# Patient Record
Sex: Female | Born: 1955
Health system: Southern US, Community
[De-identification: ages and names within clinical notes are randomized; demographics above are authoritative.]

## PROBLEM LIST (undated history)

## (undated) DIAGNOSIS — E669 Obesity, unspecified: Secondary | ICD-10-CM

## (undated) DIAGNOSIS — T7840XA Allergy, unspecified, initial encounter: Secondary | ICD-10-CM

## (undated) DIAGNOSIS — I10 Essential (primary) hypertension: Secondary | ICD-10-CM

## (undated) HISTORY — DX: Allergy, unspecified, initial encounter: T78.40XA

## (undated) HISTORY — DX: Obesity, unspecified: E66.9

---

## 1980-10-02 HISTORY — PX: TUBAL LIGATION: SHX77

## 2000-08-01 ENCOUNTER — Encounter: Payer: Self-pay | Admitting: Emergency Medicine

## 2000-08-01 ENCOUNTER — Encounter: Admission: RE | Admit: 2000-08-01 | Discharge: 2000-08-01 | Payer: Self-pay | Admitting: Emergency Medicine

## 2000-10-02 ENCOUNTER — Encounter: Payer: Self-pay | Admitting: Emergency Medicine

## 2000-10-02 ENCOUNTER — Emergency Department (HOSPITAL_COMMUNITY): Admission: EM | Admit: 2000-10-02 | Discharge: 2000-10-02 | Payer: Self-pay | Admitting: Emergency Medicine

## 2001-08-01 ENCOUNTER — Encounter: Payer: Self-pay | Admitting: Emergency Medicine

## 2001-08-01 ENCOUNTER — Encounter: Admission: RE | Admit: 2001-08-01 | Discharge: 2001-08-01 | Payer: Self-pay | Admitting: Emergency Medicine

## 2001-10-30 ENCOUNTER — Other Ambulatory Visit: Admission: RE | Admit: 2001-10-30 | Discharge: 2001-10-30 | Payer: Self-pay | Admitting: Emergency Medicine

## 2002-07-17 ENCOUNTER — Encounter: Payer: Self-pay | Admitting: Family Medicine

## 2002-07-17 ENCOUNTER — Encounter: Admission: RE | Admit: 2002-07-17 | Discharge: 2002-07-17 | Payer: Self-pay | Admitting: Family Medicine

## 2002-08-05 ENCOUNTER — Encounter: Payer: Self-pay | Admitting: Emergency Medicine

## 2002-08-05 ENCOUNTER — Ambulatory Visit (HOSPITAL_COMMUNITY): Admission: RE | Admit: 2002-08-05 | Discharge: 2002-08-05 | Payer: Self-pay | Admitting: Emergency Medicine

## 2003-08-07 ENCOUNTER — Ambulatory Visit (HOSPITAL_COMMUNITY): Admission: RE | Admit: 2003-08-07 | Discharge: 2003-08-07 | Payer: Self-pay | Admitting: Emergency Medicine

## 2004-08-30 ENCOUNTER — Ambulatory Visit (HOSPITAL_COMMUNITY): Admission: RE | Admit: 2004-08-30 | Discharge: 2004-08-30 | Payer: Self-pay | Admitting: Pulmonary Disease

## 2005-09-18 ENCOUNTER — Ambulatory Visit (HOSPITAL_COMMUNITY): Admission: RE | Admit: 2005-09-18 | Discharge: 2005-09-18 | Payer: Self-pay | Admitting: Emergency Medicine

## 2006-07-09 ENCOUNTER — Encounter: Admission: RE | Admit: 2006-07-09 | Discharge: 2006-07-09 | Payer: Self-pay | Admitting: Occupational Medicine

## 2006-07-30 ENCOUNTER — Ambulatory Visit (HOSPITAL_COMMUNITY): Admission: RE | Admit: 2006-07-30 | Discharge: 2006-07-30 | Payer: Self-pay | Admitting: Emergency Medicine

## 2006-12-31 ENCOUNTER — Emergency Department (HOSPITAL_COMMUNITY): Admission: EM | Admit: 2006-12-31 | Discharge: 2006-12-31 | Payer: Self-pay | Admitting: Emergency Medicine

## 2007-10-08 ENCOUNTER — Ambulatory Visit (HOSPITAL_COMMUNITY): Admission: RE | Admit: 2007-10-08 | Discharge: 2007-10-08 | Payer: Self-pay | Admitting: Family Medicine

## 2009-07-27 ENCOUNTER — Ambulatory Visit (HOSPITAL_COMMUNITY): Admission: RE | Admit: 2009-07-27 | Discharge: 2009-07-27 | Payer: Self-pay | Admitting: Family Medicine

## 2009-08-06 ENCOUNTER — Other Ambulatory Visit: Admission: RE | Admit: 2009-08-06 | Discharge: 2009-08-06 | Payer: Self-pay | Admitting: *Deleted

## 2009-08-30 ENCOUNTER — Other Ambulatory Visit: Admission: RE | Admit: 2009-08-30 | Discharge: 2009-08-30 | Payer: Self-pay | Admitting: *Deleted

## 2010-07-28 ENCOUNTER — Ambulatory Visit (HOSPITAL_COMMUNITY): Admission: RE | Admit: 2010-07-28 | Discharge: 2010-07-28 | Payer: Self-pay | Admitting: Family Medicine

## 2011-07-18 ENCOUNTER — Other Ambulatory Visit (HOSPITAL_COMMUNITY): Payer: Self-pay | Admitting: Family Medicine

## 2011-07-18 DIAGNOSIS — Z1231 Encounter for screening mammogram for malignant neoplasm of breast: Secondary | ICD-10-CM

## 2011-07-31 ENCOUNTER — Other Ambulatory Visit (HOSPITAL_COMMUNITY)
Admission: RE | Admit: 2011-07-31 | Discharge: 2011-07-31 | Disposition: A | Discharge status: Home / Self Care | Payer: Self-pay | Source: Ambulatory Visit | Attending: Pediatrics | Admitting: Pediatrics

## 2011-07-31 ENCOUNTER — Other Ambulatory Visit: Payer: Self-pay | Admitting: Physician Assistant

## 2011-07-31 ENCOUNTER — Ambulatory Visit
Admission: RE | Admit: 2011-07-31 | Discharge: 2011-07-31 | Disposition: A | Discharge status: Home / Self Care | Payer: Self-pay | Source: Ambulatory Visit | Attending: Family Medicine | Admitting: Family Medicine

## 2011-07-31 DIAGNOSIS — Z1231 Encounter for screening mammogram for malignant neoplasm of breast: Secondary | ICD-10-CM

## 2011-07-31 DIAGNOSIS — Z113 Encounter for screening for infections with a predominantly sexual mode of transmission: Secondary | ICD-10-CM | POA: Insufficient documentation

## 2011-07-31 DIAGNOSIS — Z01419 Encounter for gynecological examination (general) (routine) without abnormal findings: Secondary | ICD-10-CM | POA: Insufficient documentation

## 2012-09-06 ENCOUNTER — Emergency Department (HOSPITAL_COMMUNITY)
Admission: EM | Admit: 2012-09-06 | Discharge: 2012-09-06 | Disposition: A | Discharge status: Home / Self Care | Payer: No Typology Code available for payment source

## 2012-09-06 NOTE — ED Notes (Signed)
Pt not in WR when called for triage.

## 2012-09-07 ENCOUNTER — Encounter (HOSPITAL_BASED_OUTPATIENT_CLINIC_OR_DEPARTMENT_OTHER): Payer: Self-pay | Admitting: *Deleted

## 2012-09-07 ENCOUNTER — Emergency Department (HOSPITAL_BASED_OUTPATIENT_CLINIC_OR_DEPARTMENT_OTHER)
Admission: EM | Admit: 2012-09-07 | Discharge: 2012-09-08 | Disposition: A | Discharge status: Home / Self Care | Payer: No Typology Code available for payment source | Attending: Emergency Medicine | Admitting: Emergency Medicine

## 2012-09-07 DIAGNOSIS — Y92410 Unspecified street and highway as the place of occurrence of the external cause: Secondary | ICD-10-CM | POA: Insufficient documentation

## 2012-09-07 DIAGNOSIS — M546 Pain in thoracic spine: Secondary | ICD-10-CM | POA: Insufficient documentation

## 2012-09-07 DIAGNOSIS — M545 Low back pain, unspecified: Secondary | ICD-10-CM | POA: Insufficient documentation

## 2012-09-07 DIAGNOSIS — Z79899 Other long term (current) drug therapy: Secondary | ICD-10-CM | POA: Insufficient documentation

## 2012-09-07 DIAGNOSIS — I10 Essential (primary) hypertension: Secondary | ICD-10-CM | POA: Insufficient documentation

## 2012-09-07 DIAGNOSIS — Y93I9 Activity, other involving external motion: Secondary | ICD-10-CM | POA: Insufficient documentation

## 2012-09-07 HISTORY — DX: Essential (primary) hypertension: I10

## 2012-09-07 MED ORDER — HYDROCODONE-ACETAMINOPHEN 5-325 MG PO TABS: 2.0000 | ORAL_TABLET | Freq: Four times a day (QID) | ORAL | Status: DC | PRN
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
## 2012-09-28 ENCOUNTER — Emergency Department (HOSPITAL_BASED_OUTPATIENT_CLINIC_OR_DEPARTMENT_OTHER): Payer: No Typology Code available for payment source

## 2012-09-28 ENCOUNTER — Emergency Department (HOSPITAL_BASED_OUTPATIENT_CLINIC_OR_DEPARTMENT_OTHER)
Admission: EM | Admit: 2012-09-28 | Discharge: 2012-09-28 | Disposition: A | Discharge status: Home / Self Care | Payer: No Typology Code available for payment source | Attending: Emergency Medicine | Admitting: Emergency Medicine

## 2012-09-28 ENCOUNTER — Encounter (HOSPITAL_BASED_OUTPATIENT_CLINIC_OR_DEPARTMENT_OTHER): Payer: Self-pay | Admitting: *Deleted

## 2012-09-28 DIAGNOSIS — I10 Essential (primary) hypertension: Secondary | ICD-10-CM | POA: Insufficient documentation

## 2012-09-28 DIAGNOSIS — Z79899 Other long term (current) drug therapy: Secondary | ICD-10-CM | POA: Insufficient documentation

## 2012-09-28 DIAGNOSIS — Z791 Long term (current) use of non-steroidal anti-inflammatories (NSAID): Secondary | ICD-10-CM | POA: Insufficient documentation

## 2012-09-28 DIAGNOSIS — M549 Dorsalgia, unspecified: Secondary | ICD-10-CM

## 2012-09-28 MED ORDER — IBUPROFEN 800 MG PO TABS: 800.0000 mg | ORAL_TABLET | Freq: Three times a day (TID) | ORAL | Status: DC | PRN

## 2012-09-28 MED ORDER — HYDROCODONE-ACETAMINOPHEN 5-325 MG PO TABS: 2.0000 | ORAL_TABLET | Freq: Four times a day (QID) | ORAL | Status: DC | PRN
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
## 2012-10-03 ENCOUNTER — Emergency Department (HOSPITAL_COMMUNITY): Payer: PRIVATE HEALTH INSURANCE

## 2012-10-03 ENCOUNTER — Emergency Department (HOSPITAL_COMMUNITY)
Admission: EM | Admit: 2012-10-03 | Discharge: 2012-10-03 | Disposition: A | Discharge status: Home / Self Care | Payer: PRIVATE HEALTH INSURANCE | Attending: Emergency Medicine | Admitting: Emergency Medicine

## 2012-10-03 ENCOUNTER — Encounter (HOSPITAL_COMMUNITY): Payer: Self-pay | Admitting: *Deleted

## 2012-10-03 DIAGNOSIS — Z79899 Other long term (current) drug therapy: Secondary | ICD-10-CM | POA: Insufficient documentation

## 2012-10-03 DIAGNOSIS — I10 Essential (primary) hypertension: Secondary | ICD-10-CM | POA: Insufficient documentation

## 2012-10-03 DIAGNOSIS — Y9289 Other specified places as the place of occurrence of the external cause: Secondary | ICD-10-CM | POA: Insufficient documentation

## 2012-10-03 DIAGNOSIS — S82899A Other fracture of unspecified lower leg, initial encounter for closed fracture: Secondary | ICD-10-CM | POA: Insufficient documentation

## 2012-10-03 DIAGNOSIS — Y9301 Activity, walking, marching and hiking: Secondary | ICD-10-CM | POA: Insufficient documentation

## 2012-10-03 DIAGNOSIS — S9306XA Dislocation of unspecified ankle joint, initial encounter: Secondary | ICD-10-CM | POA: Insufficient documentation

## 2012-10-03 DIAGNOSIS — W010XXA Fall on same level from slipping, tripping and stumbling without subsequent striking against object, initial encounter: Secondary | ICD-10-CM | POA: Insufficient documentation

## 2012-10-03 MED ORDER — BUPIVACAINE-EPINEPHRINE PF 0.25-1:200000 % IJ SOLN
INTRAMUSCULAR | Status: AC
  Administered 2012-10-03: 30 mL
  Filled 2012-10-03: qty 30

## 2012-10-03 MED ORDER — BUPIVACAINE HCL 0.25 % IJ SOLN: 20.0000 mL | Freq: Once | INTRAMUSCULAR | Status: DC

## 2012-10-03 MED ORDER — BUPIVACAINE HCL (PF) 0.25 % IJ SOLN: 20.0000 mL | Freq: Once | INTRAMUSCULAR | Status: AC

## 2012-10-03 MED ORDER — FENTANYL CITRATE 0.05 MG/ML IJ SOLN
100.0000 ug | Freq: Once | INTRAMUSCULAR | Status: AC
  Administered 2012-10-03: 100 ug via INTRAVENOUS
  Filled 2012-10-03: qty 2

## 2012-10-03 MED ORDER — OXYCODONE-ACETAMINOPHEN 5-325 MG PO TABS: 2.0000 | ORAL_TABLET | ORAL | Status: DC | PRN

## 2012-10-03 MED ORDER — FENTANYL CITRATE 0.05 MG/ML IJ SOLN
50.0000 ug | Freq: Once | INTRAMUSCULAR | Status: AC
  Administered 2012-10-03: 50 ug via INTRAVENOUS
  Filled 2012-10-03: qty 2
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
## 2012-10-03 NOTE — ED Notes (Signed)
WUJ:[id number]<JY> Expected date:<BR> Expected time:<BR> Means of arrival:Ambulance<BR> Comments:<BR> Ankle deformity

## 2012-10-03 NOTE — ED Notes (Signed)
Per md pt allowed to have PO fluids 

## 2012-10-03 NOTE — ED Notes (Signed)
Pt reports she gave a medicine bottle to ems, ems did not given any medication bottles to rn when arriving to ED

## 2012-10-03 NOTE — ED Notes (Signed)
Ortho tech at bedside 

## 2012-10-03 NOTE — ED Notes (Signed)
md at bedside  Pt alert and oriented x4. Respirations even and unlabored, bilateral symmetrical rise and fall of chest. Skin warm and dry. In no acute distress. Denies needs.   

## 2012-10-03 NOTE — ED Notes (Signed)
Pt alert and oriented x4. Respirations even and unlabored, bilateral symmetrical rise and fall of chest. Skin warm and dry. In no acute distress. Denies needs.   

## 2012-10-03 NOTE — ED Notes (Signed)
Per ems pt is [employer] employee. She was walking and slipped down a grass embankment. Deformity noted to right ankle/foot. Pedal pulse strong. Immobilized as found. Able to wiggle toes. PMS intact. 20 g L hand. Given fentanyl in route. Pain 8/10 upon arrival to ED

## 2012-10-03 NOTE — ED Notes (Signed)
Pt to xray

## 2012-10-07 ENCOUNTER — Other Ambulatory Visit (HOSPITAL_COMMUNITY): Payer: Self-pay | Admitting: Orthopaedic Surgery

## 2012-10-08 ENCOUNTER — Other Ambulatory Visit (HOSPITAL_COMMUNITY): Payer: Self-pay | Admitting: Orthopaedic Surgery

## 2012-10-08 ENCOUNTER — Encounter (HOSPITAL_COMMUNITY): Payer: Self-pay | Admitting: Pharmacy Technician

## 2012-10-08 NOTE — Patient Instructions (Addendum)
20 Savannah Peterson  10/08/2012   Your procedure is scheduled on: 10-11-2012  Report to [facility] at 1250 PM  Call this number if you have problems the morning of surgery [phone]   Remember:   Do not eat food  :After Midnight tonight.              clear Liquids midnight until 0920 am day of surgery, then nothing by mouth.     Take these medicines the morning of surgery with A SIP OF WATER: oxycodone if needed   Do not wear jewelry, make-up or nail polish.  Do not wear lotions, powders, or perfumes. You may wear deodorant.   Men may shave face and neck.  Do not bring valuables to the hospital.  Contacts, dentures or bridgework may not be worn into surgery.  Leave suitcase in the car. After surgery it may be brought to your room.  For patients admitted to the hospital, checkout time is 11:00 AM the day of discharge.   Patients discharged the day of surgery will not be allowed to drive home.  Name and phone number of your driver:  Special Instructions: N/A   Please read over the following fact sheets that you were given: MRSA Information.  Call Cain Sieve RN pre op nurse if needed [phone]    FAILURE TO FOLLOW THESE INSTRUCTIONS MAY RESULT IN THE CANCELLATION OF YOUR SURGERY. PATIENT SIGNATURE___________________________________________

## 2012-10-09 ENCOUNTER — Encounter (HOSPITAL_COMMUNITY): Payer: Self-pay

## 2012-10-09 ENCOUNTER — Encounter (HOSPITAL_COMMUNITY)
Admission: RE | Admit: 2012-10-09 | Discharge: 2012-10-09 | Disposition: A | Discharge status: Home / Self Care | Payer: PRIVATE HEALTH INSURANCE | Source: Ambulatory Visit | Attending: Orthopaedic Surgery | Admitting: Orthopaedic Surgery

## 2012-10-09 ENCOUNTER — Ambulatory Visit (HOSPITAL_COMMUNITY)
Admission: RE | Admit: 2012-10-09 | Discharge: 2012-10-09 | Disposition: A | Discharge status: Home / Self Care | Payer: PRIVATE HEALTH INSURANCE | Source: Ambulatory Visit | Attending: Orthopaedic Surgery | Admitting: Orthopaedic Surgery

## 2012-10-09 DIAGNOSIS — Z01818 Encounter for other preprocedural examination: Secondary | ICD-10-CM | POA: Insufficient documentation

## 2012-10-09 DIAGNOSIS — I10 Essential (primary) hypertension: Secondary | ICD-10-CM | POA: Insufficient documentation

## 2012-10-09 DIAGNOSIS — Z01810 Encounter for preprocedural cardiovascular examination: Secondary | ICD-10-CM | POA: Insufficient documentation

## 2012-10-09 DIAGNOSIS — R9431 Abnormal electrocardiogram [ECG] [EKG]: Secondary | ICD-10-CM | POA: Insufficient documentation

## 2012-10-09 DIAGNOSIS — Z01812 Encounter for preprocedural laboratory examination: Secondary | ICD-10-CM | POA: Insufficient documentation

## 2012-10-09 LAB — BASIC METABOLIC PANEL
BUN: 12 mg/dL (ref 6–23)
Calcium: 10.2 mg/dL (ref 8.4–10.5)
Creatinine, Ser: 0.69 mg/dL (ref 0.50–1.10)
GFR calc Af Amer: >90 mL/min (ref >90)
GFR calc non Af Amer: >90 mL/min (ref >90)

## 2012-10-09 LAB — SURGICAL PCR SCREEN
MRSA, PCR: NEGATIVE
Staphylococcus aureus: NEGATIVE

## 2012-10-09 LAB — CBC
MCHC: 33.6 g/dL (ref 30.0–36.0)
RDW: 13.7 % (ref 11.5–15.5)

## 2012-10-11 ENCOUNTER — Encounter (HOSPITAL_COMMUNITY)
Admission: RE | Disposition: A | Discharge status: Home / Self Care | Payer: Self-pay | Source: Ambulatory Visit | Attending: Orthopaedic Surgery

## 2012-10-11 ENCOUNTER — Ambulatory Visit (HOSPITAL_COMMUNITY): Payer: PRIVATE HEALTH INSURANCE | Admitting: Anesthesiology

## 2012-10-11 ENCOUNTER — Encounter (HOSPITAL_COMMUNITY): Payer: Self-pay | Admitting: Anesthesiology

## 2012-10-11 ENCOUNTER — Encounter (HOSPITAL_COMMUNITY): Payer: Self-pay | Admitting: *Deleted

## 2012-10-11 ENCOUNTER — Ambulatory Visit (HOSPITAL_COMMUNITY): Payer: PRIVATE HEALTH INSURANCE

## 2012-10-11 ENCOUNTER — Ambulatory Visit (HOSPITAL_COMMUNITY)
Admission: RE | Admit: 2012-10-11 | Discharge: 2012-10-12 | Disposition: A | Discharge status: Home / Self Care | Payer: PRIVATE HEALTH INSURANCE | Source: Ambulatory Visit | Attending: Orthopaedic Surgery | Admitting: Orthopaedic Surgery

## 2012-10-11 DIAGNOSIS — W19XXXA Unspecified fall, initial encounter: Secondary | ICD-10-CM | POA: Insufficient documentation

## 2012-10-11 DIAGNOSIS — S8263XA Displaced fracture of lateral malleolus of unspecified fibula, initial encounter for closed fracture: Principal | ICD-10-CM | POA: Insufficient documentation

## 2012-10-11 DIAGNOSIS — S8261XA Displaced fracture of lateral malleolus of right fibula, initial encounter for closed fracture: Secondary | ICD-10-CM

## 2012-10-11 DIAGNOSIS — I10 Essential (primary) hypertension: Secondary | ICD-10-CM | POA: Insufficient documentation

## 2012-10-11 DIAGNOSIS — S93439A Sprain of tibiofibular ligament of unspecified ankle, initial encounter: Secondary | ICD-10-CM | POA: Insufficient documentation

## 2012-10-11 HISTORY — PX: ORIF ANKLE FRACTURE: SHX5408

## 2012-10-11 SURGERY — OPEN REDUCTION INTERNAL FIXATION (ORIF) ANKLE FRACTURE
Anesthesia: General | Site: Ankle | Laterality: Right | Wound class: Clean

## 2012-10-11 MED ORDER — METHOCARBAMOL 500 MG PO TABS: 500.0000 mg | ORAL_TABLET | Freq: Four times a day (QID) | ORAL | Status: DC | PRN

## 2012-10-11 MED ORDER — FENTANYL CITRATE 0.05 MG/ML IJ SOLN
INTRAMUSCULAR | Status: DC | PRN
  Administered 2012-10-11 (×5): 50 ug via INTRAVENOUS

## 2012-10-11 MED ORDER — MORPHINE SULFATE 2 MG/ML IJ SOLN
1.0000 mg | INTRAMUSCULAR | Status: DC | PRN
  Administered 2012-10-11 – 2012-10-12 (×2): 1 mg via INTRAVENOUS
  Filled 2012-10-11 (×2): qty 1

## 2012-10-11 MED ORDER — CLINDAMYCIN PHOSPHATE 900 MG/50ML IV SOLN
900.0000 mg | INTRAVENOUS | Status: AC
  Administered 2012-10-11: 900 mg via INTRAVENOUS

## 2012-10-11 MED ORDER — ACETAMINOPHEN 10 MG/ML IV SOLN
INTRAVENOUS | Status: AC
  Filled 2012-10-11: qty 100

## 2012-10-11 MED ORDER — SUCCINYLCHOLINE CHLORIDE 20 MG/ML IJ SOLN
INTRAMUSCULAR | Status: DC | PRN
  Administered 2012-10-11: 100 mg via INTRAVENOUS

## 2012-10-11 MED ORDER — LISINOPRIL-HYDROCHLOROTHIAZIDE 20-25 MG PO TABS: 1.0000 | ORAL_TABLET | Freq: Every morning | ORAL | Status: DC

## 2012-10-11 MED ORDER — METOCLOPRAMIDE HCL 5 MG/ML IJ SOLN: 5.0000 mg | Freq: Three times a day (TID) | INTRAMUSCULAR | Status: DC | PRN

## 2012-10-11 MED ORDER — LACTATED RINGERS IV SOLN
INTRAVENOUS | Status: DC
  Administered 2012-10-11: 1000 mL via INTRAVENOUS

## 2012-10-11 MED ORDER — SODIUM CHLORIDE 0.9 % IV SOLN
INTRAVENOUS | Status: DC
  Administered 2012-10-11: 20:00:00 via INTRAVENOUS

## 2012-10-11 MED ORDER — PROMETHAZINE HCL 25 MG/ML IJ SOLN: 6.2500 mg | INTRAMUSCULAR | Status: DC | PRN

## 2012-10-11 MED ORDER — HYDROMORPHONE HCL PF 1 MG/ML IJ SOLN
0.2500 mg | INTRAMUSCULAR | Status: DC | PRN
  Administered 2012-10-11 (×2): 0.5 mg via INTRAVENOUS

## 2012-10-11 MED ORDER — CLINDAMYCIN PHOSPHATE 600 MG/50ML IV SOLN
600.0000 mg | Freq: Four times a day (QID) | INTRAVENOUS | Status: AC
  Administered 2012-10-11 – 2012-10-12 (×3): 600 mg via INTRAVENOUS
  Filled 2012-10-11 (×4): qty 50

## 2012-10-11 MED ORDER — METHOCARBAMOL 100 MG/ML IJ SOLN
500.0000 mg | Freq: Four times a day (QID) | INTRAVENOUS | Status: DC | PRN
  Filled 2012-10-11: qty 5

## 2012-10-11 MED ORDER — OXYCODONE HCL 5 MG PO TABS: 5.0000 mg | ORAL_TABLET | ORAL | Status: DC | PRN

## 2012-10-11 MED ORDER — LIDOCAINE HCL (CARDIAC) 20 MG/ML IV SOLN
INTRAVENOUS | Status: DC | PRN
  Administered 2012-10-11: 100 mg via INTRAVENOUS

## 2012-10-11 MED ORDER — HYDROMORPHONE HCL PF 1 MG/ML IJ SOLN
INTRAMUSCULAR | Status: DC | PRN
  Administered 2012-10-11 (×2): 1 mg via INTRAVENOUS

## 2012-10-11 MED ORDER — LACTATED RINGERS IV SOLN: INTRAVENOUS | Status: DC

## 2012-10-11 MED ORDER — ACETAMINOPHEN 10 MG/ML IV SOLN
INTRAVENOUS | Status: DC | PRN
  Administered 2012-10-11: 1000 mg via INTRAVENOUS

## 2012-10-11 MED ORDER — ONDANSETRON HCL 4 MG PO TABS: 4.0000 mg | ORAL_TABLET | Freq: Four times a day (QID) | ORAL | Status: DC | PRN

## 2012-10-11 MED ORDER — MIDAZOLAM HCL 5 MG/5ML IJ SOLN
INTRAMUSCULAR | Status: DC | PRN
  Administered 2012-10-11: 2 mg via INTRAVENOUS

## 2012-10-11 MED ORDER — METOCLOPRAMIDE HCL 10 MG PO TABS: 5.0000 mg | ORAL_TABLET | Freq: Three times a day (TID) | ORAL | Status: DC | PRN

## 2012-10-11 MED ORDER — OXYCODONE-ACETAMINOPHEN 5-325 MG PO TABS: 2.0000 | ORAL_TABLET | ORAL | Status: DC | PRN

## 2012-10-11 MED ORDER — HYDROCHLOROTHIAZIDE 25 MG PO TABS
25.0000 mg | ORAL_TABLET | Freq: Every day | ORAL | Status: DC
  Administered 2012-10-12: 25 mg via ORAL
  Filled 2012-10-11: qty 1

## 2012-10-11 MED ORDER — 0.9 % SODIUM CHLORIDE (POUR BTL) OPTIME
TOPICAL | Status: DC | PRN
  Administered 2012-10-11: 1000 mL

## 2012-10-11 MED ORDER — DOCUSATE SODIUM 100 MG PO CAPS
100.0000 mg | ORAL_CAPSULE | Freq: Two times a day (BID) | ORAL | Status: DC
  Administered 2012-10-11 – 2012-10-12 (×2): 100 mg via ORAL

## 2012-10-11 MED ORDER — DEXAMETHASONE SODIUM PHOSPHATE 10 MG/ML IJ SOLN
INTRAMUSCULAR | Status: DC | PRN
  Administered 2012-10-11: 10 mg via INTRAVENOUS

## 2012-10-11 MED ORDER — ONDANSETRON HCL 4 MG/2ML IJ SOLN
INTRAMUSCULAR | Status: DC | PRN
  Administered 2012-10-11: 4 mg via INTRAVENOUS

## 2012-10-11 MED ORDER — ONDANSETRON HCL 4 MG/2ML IJ SOLN: 4.0000 mg | Freq: Four times a day (QID) | INTRAMUSCULAR | Status: DC | PRN

## 2012-10-11 MED ORDER — ADULT MULTIVITAMIN W/MINERALS CH
1.0000 | ORAL_TABLET | Freq: Every day | ORAL | Status: DC
  Administered 2012-10-12: 1 via ORAL
  Filled 2012-10-11: qty 1

## 2012-10-11 MED ORDER — PROPOFOL 10 MG/ML IV BOLUS
INTRAVENOUS | Status: DC | PRN
  Administered 2012-10-11: 170 mg via INTRAVENOUS

## 2012-10-11 MED ORDER — HYDROCODONE-ACETAMINOPHEN 5-325 MG PO TABS
1.0000 | ORAL_TABLET | ORAL | Status: DC | PRN
  Administered 2012-10-11 – 2012-10-12 (×3): 2 via ORAL
  Filled 2012-10-11 (×3): qty 2

## 2012-10-11 MED ORDER — LISINOPRIL 20 MG PO TABS
20.0000 mg | ORAL_TABLET | Freq: Every day | ORAL | Status: DC
  Administered 2012-10-12: 20 mg via ORAL
  Filled 2012-10-11: qty 1

## 2012-10-11 MED ORDER — CLINDAMYCIN PHOSPHATE 900 MG/50ML IV SOLN
INTRAVENOUS | Status: AC
  Filled 2012-10-11: qty 50

## 2012-10-11 MED ORDER — ZOLPIDEM TARTRATE 5 MG PO TABS: 5.0000 mg | ORAL_TABLET | Freq: Every evening | ORAL | Status: DC | PRN

## 2012-10-11 MED ORDER — BUPIVACAINE HCL (PF) 0.5 % IJ SOLN
INTRAMUSCULAR | Status: AC
  Filled 2012-10-11: qty 30

## 2012-10-11 MED ORDER — HYDROMORPHONE HCL PF 1 MG/ML IJ SOLN
INTRAMUSCULAR | Status: AC
  Filled 2012-10-11: qty 1

## 2012-10-11 MED ORDER — DIPHENHYDRAMINE HCL 12.5 MG/5ML PO ELIX: 12.5000 mg | ORAL_SOLUTION | ORAL | Status: DC | PRN

## 2012-10-11 SURGICAL SUPPLY — 46 items
BAG SPEC THK2 15X12 ZIP CLS (MISCELLANEOUS) ×1
BAG ZIPLOCK 12X15 (MISCELLANEOUS) ×2 IMPLANT
BANDAGE ELASTIC 4 VELCRO ST LF (GAUZE/BANDAGES/DRESSINGS) ×1 IMPLANT
BANDAGE ELASTIC 6 VELCRO ST LF (GAUZE/BANDAGES/DRESSINGS) ×2 IMPLANT
BIT DRILL 2.5X2.75 QC CALB (BIT) ×1 IMPLANT
BIT DRILL 3.5X5.5 QC CALB (BIT) ×2 IMPLANT
BIT DRILL CALIBRATED 2.7 (BIT) ×1 IMPLANT
CLOTH BEACON ORANGE TIMEOUT ST (SAFETY) ×2 IMPLANT
COVER SURGICAL LIGHT HANDLE (MISCELLANEOUS) ×3 IMPLANT
CUFF TOURN SGL QUICK 34 (TOURNIQUET CUFF) ×2
CUFF TRNQT CYL 34X4X40X1 (TOURNIQUET CUFF) ×1 IMPLANT
DECANTER SPIKE VIAL GLASS SM (MISCELLANEOUS) ×2 IMPLANT
DRAPE C-ARM 42X72 X-RAY (DRAPES) ×2 IMPLANT
DRAPE LG THREE QUARTER DISP (DRAPES) ×1 IMPLANT
DRAPE U-SHAPE 47X51 STRL (DRAPES) ×2 IMPLANT
DRSG ADAPTIC 3X8 NADH LF (GAUZE/BANDAGES/DRESSINGS) ×1 IMPLANT
DRSG PAD ABDOMINAL 8X10 ST (GAUZE/BANDAGES/DRESSINGS) ×2 IMPLANT
ELECT REM PT RETURN 9FT ADLT (ELECTROSURGICAL) ×2
ELECTRODE REM PT RTRN 9FT ADLT (ELECTROSURGICAL) ×1 IMPLANT
GAUZE XEROFORM 1X8 LF (GAUZE/BANDAGES/DRESSINGS) ×1 IMPLANT
GLOVE BIO SURGEON STRL SZ7.5 (GLOVE) ×2 IMPLANT
GOWN STRL REIN XL XLG (GOWN DISPOSABLE) ×4 IMPLANT
NEEDLE HYPO 22GX1.5 SAFETY (NEEDLE) ×2 IMPLANT
PACK LOWER EXTREMITY WL (CUSTOM PROCEDURE TRAY) ×2 IMPLANT
PAD CAST 4YDX4 CTTN HI CHSV (CAST SUPPLIES) ×2 IMPLANT
PADDING CAST ABS 4INX4YD NS (CAST SUPPLIES) ×1
PADDING CAST ABS COTTON 4X4 ST (CAST SUPPLIES) IMPLANT
PADDING CAST COTTON 4X4 STRL (CAST SUPPLIES)
PADDING CAST COTTON 6X4 STRL (CAST SUPPLIES) ×1 IMPLANT
PLATE LOCK 8H 103 BILAT FIB (Plate) ×2 IMPLANT
POSITIONER SURGICAL ARM (MISCELLANEOUS) ×2 IMPLANT
SCREW LOCK 3.5X10 DIST TIB (Screw) ×1 IMPLANT
SCREW LOCK CORT STAR 3.5X10 (Screw) ×1 IMPLANT
SCREW LOW PROFILE 3.5X48MM (Screw) ×1 IMPLANT
SCREW LP 3.5 (Screw) ×1 IMPLANT
SCREW LP 3.5X38MM (Screw) ×2 IMPLANT
SCREW NON LOCKING LP 3.5 14MM (Screw) ×2 IMPLANT
SCREW NON LOCKING LP 3.5 16MM (Screw) ×2 IMPLANT
SPLINT PLASTER CAST XFAST 5X30 (CAST SUPPLIES) IMPLANT
SPLINT PLASTER XFAST SET 5X30 (CAST SUPPLIES) ×1
SPONGE GAUZE 4X4 12PLY (GAUZE/BANDAGES/DRESSINGS) ×1 IMPLANT
SUT ETHILON 4 0 PS 2 18 (SUTURE) ×2 IMPLANT
SUT VIC AB 2-0 CT1 27 (SUTURE)
SUT VIC AB 2-0 CT1 TAPERPNT 27 (SUTURE) ×1 IMPLANT
SYR CONTROL 10ML LL (SYRINGE) ×2 IMPLANT
TOWEL OR 17X26 10 PK STRL BLUE (TOWEL DISPOSABLE) ×4 IMPLANT

## 2012-10-11 NOTE — Anesthesia Preprocedure Evaluation (Signed)
Anesthesia Evaluation  Patient identified by MRN, date of birth, ID band Patient awake    Reviewed: Allergy & Precautions, H&P , NPO status , Patient's Chart, lab work & pertinent test results  Airway Mallampati: II TM Distance: >3 FB Neck ROM: Full    Dental  (+) Teeth Intact and Dental Advisory Given   Pulmonary neg pulmonary ROS,  breath sounds clear to auscultation  Pulmonary exam normal       Cardiovascular hypertension, Pt. on medications Rhythm:Regular Rate:Normal     Neuro/Psych negative neurological ROS  negative psych ROS   GI/Hepatic negative GI ROS, Neg liver ROS,   Endo/Other  negative endocrine ROS  Renal/GU negative Renal ROS  negative genitourinary   Musculoskeletal negative musculoskeletal ROS (+)   Abdominal   Peds  Hematology negative hematology ROS (+)   Anesthesia Other Findings   Reproductive/Obstetrics negative OB ROS                           Anesthesia Physical Anesthesia Plan  ASA: II  Anesthesia Plan: General   Post-op Pain Management:    Induction: Intravenous  Airway Management Planned: Oral ETT  Additional Equipment:   Intra-op Plan:   Post-operative Plan: Extubation in OR  Informed Consent: I have reviewed the patients History and Physical, chart, labs and discussed the procedure including the risks, benefits and alternatives for the proposed anesthesia with the patient or authorized representative who has indicated his/her understanding and acceptance.   Dental advisory given  Plan Discussed with: CRNA  Anesthesia Plan Comments:         Anesthesia Quick Evaluation

## 2012-10-11 NOTE — Brief Op Note (Signed)
10/11/2012  6:00 PM  PATIENT:  Margarite Gouge  57 y.o. female  PRE-OPERATIVE DIAGNOSIS:  Right unstable ankle fracture/dislocation  POST-OPERATIVE DIAGNOSIS:  Right unstable ankle fracture/dislocation  PROCEDURE:  Procedure(s) (LRB) with comments: OPEN REDUCTION INTERNAL FIXATION (ORIF) ANKLE FRACTURE (Right) - Open Reduction Internal Fixation Right Ankle Fracture  SURGEON:  Surgeon(s) and Role:    * Kathryne Hitch, MD - Primary  PHYSICIAN ASSISTANT:   ASSISTANTS: none   ANESTHESIA:   general  EBL:  Total I/O In: 800 [I.V.:800] Out: -   BLOOD ADMINISTERED:none  DRAINS: none   LOCAL MEDICATIONS USED:  NONE  SPECIMEN:  No Specimen  DISPOSITION OF SPECIMEN:  N/A  COUNTS:  YES  TOURNIQUET:   Total Tourniquet Time Documented: Thigh (Right) - 60 minutes  DICTATION: .Other Dictation: Dictation Number [id number]  PLAN OF CARE: Admit for overnight observation  PATIENT DISPOSITION:  PACU - hemodynamically stable.   Delay start of Pharmacological VTE agent (>24hrs) due to surgical blood loss or risk of bleeding: no

## 2012-10-11 NOTE — H&P (Signed)
Savannah Peterson is an 57 y.o. female.   Chief Complaint:   Right ankle pain; known fracture/dislocation HPI:   57 yo female who last week sustained a mechanical fall causing a fracture/dislocation of her right ankle.  A closed reduction and splinting was performed in the ER.  She now presents for definitive fixation of this unstable right ankle injury.  Past Medical History  Diagnosis Date  . Hypertension     Past Surgical History  Procedure Date  . Tubal ligation 1982    No family history on file. Social History:  reports that she has never smoked. She has never used smokeless tobacco. She reports that she does not drink alcohol or use illicit drugs.  Allergies:  Allergies  Allergen Reactions  . Penicillins Rash    No prescriptions prior to admission    Results for orders placed during the hospital encounter of 10/09/12 (from the past 48 hour(s))  SURGICAL PCR SCREEN     Status: Normal   Collection Time   10/09/12 11:50 AM      Component Value Range Comment   MRSA, PCR NEGATIVE  NEGATIVE    Staphylococcus aureus NEGATIVE  NEGATIVE   BASIC METABOLIC PANEL     Status: Abnormal   Collection Time   10/09/12 12:00 PM      Component Value Range Comment   Sodium 140  135 - 145 mEq/L    Potassium 4.2  3.5 - 5.1 mEq/L    Chloride 100  96 - 112 mEq/L    CO2 29  19 - 32 mEq/L    Glucose, Bld 115 (*) 70 - 99 mg/dL    BUN 12  6 - 23 mg/dL    Creatinine, Ser 1.61  0.50 - 1.10 mg/dL    Calcium 09.6  8.4 - 10.5 mg/dL    GFR calc non Af Amer >90  >90 mL/min    GFR calc Af Amer >90  >90 mL/min   CBC     Status: Normal   Collection Time   10/09/12 12:00 PM      Component Value Range Comment   WBC 7.8  4.0 - 10.5 K/uL    RBC 4.40  3.87 - 5.11 MIL/uL    Hemoglobin 13.8  12.0 - 15.0 g/dL    HCT 04.5  40.9 - 81.1 %    MCV 93.4  78.0 - 100.0 fL    MCH 31.4  26.0 - 34.0 pg    MCHC 33.6  30.0 - 36.0 g/dL    RDW 91.4  78.2 - 95.6 %    Platelets 399  150 - 400 K/uL    Dg Chest 2  View  10/09/2012  *RADIOLOGY REPORT*  Clinical Data: Hypertension.  CHEST - 2 VIEW  Comparison: None.  Findings: Cardiomediastinal silhouette appears normal.  No acute pulmonary disease is noted.  Bony thorax is intact.  IMPRESSION: No acute cardiopulmonary abnormality seen.   Original Report Authenticated By: Lupita Raider.,  M.D.     Review of Systems  All other systems reviewed and are negative.    Last menstrual period 09/07/2009. Physical Exam  Constitutional: She is oriented to person, place, and time. She appears well-developed and well-nourished.  HENT:  Head: Normocephalic and atraumatic.  Eyes: EOM are normal. Pupils are equal, round, and reactive to light.  Neck: Normal range of motion. Neck supple.  Cardiovascular: Normal rate and regular rhythm.   Respiratory: Effort normal and breath sounds normal.  GI: Soft. Bowel sounds  are normal.  Musculoskeletal:       Right ankle: She exhibits swelling, ecchymosis and deformity. tenderness. Lateral malleolus tenderness found.  Neurological: She is alert and oriented to person, place, and time.  Skin: Skin is warm and dry.  Psychiatric: She has a normal mood and affect.     Assessment/Plan Right unstable ankle fracture dislocation with lateral malleolus fracture and disruption of the syndesmosis. 1)  To the OR today for surgical stabiilzation of this unstable injury.  She understands fully the risks and benefits of surgery and informed consent is obtained.  BLACKMAN,CHRISTOPHER Y 10/11/2012, 7:17 AM

## 2012-10-11 NOTE — Transfer of Care (Signed)
Immediate Anesthesia Transfer of Care Note  Patient: Savannah Peterson  Procedure(s) Performed: Procedure(s) (LRB) with comments: OPEN REDUCTION INTERNAL FIXATION (ORIF) ANKLE FRACTURE (Right) - Open Reduction Internal Fixation Right Ankle Fracture  Patient Location: PACU  Anesthesia Type:General  Level of Consciousness: sedated  Airway & Oxygen Therapy: Patient Spontanous Breathing and Patient connected to face mask oxygen  Post-op Assessment: Report given to PACU RN and Post -op Vital signs reviewed and stable  Post vital signs: Reviewed and stable  Complications: No apparent anesthesia complications

## 2012-10-11 NOTE — Anesthesia Postprocedure Evaluation (Signed)
Anesthesia Post Note  Patient: Savannah Peterson  Procedure(s) Performed: Procedure(s) (LRB): OPEN REDUCTION INTERNAL FIXATION (ORIF) ANKLE FRACTURE (Right)  Anesthesia type: General  Patient location: PACU  Post pain: Pain level controlled  Post assessment: Post-op Vital signs reviewed  Last Vitals:  Filed Vitals:   10/11/12 1832  BP: 155/87  Pulse: 74  Temp:   Resp: 13    Post vital signs: Reviewed  Level of consciousness: sedated  Complications: No apparent anesthesia complications
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
## 2012-10-12 NOTE — Evaluation (Signed)
Physical Therapy Evaluation Patient Details Name: Savannah Peterson MRN: [id number] DOB: 05-31-1956 Today's Date: 10/12/2012 Time: 7829-5621 PT Time Calculation (min): 28 min  PT Assessment / Plan / Recommendation Clinical Impression  57 y.o. female s/p R ankle fx and ORIF. HHPT recommended for home safety eval, RW and 3 in 1 recommended. Pt to have knee scooter delivered to home. Pt ambulated 32' with RW, stair training completed. OK to DC home from PT standpoint.     PT Assessment  All further PT needs can be met in the next venue of care    Follow Up Recommendations  Home health PT    Does the patient have the potential to tolerate intense rehabilitation      Barriers to Discharge        Equipment Recommendations  Rolling walker with 5" wheels (3 in 1)    Recommendations for Other Services     Frequency      Precautions / Restrictions Precautions Precautions: Fall Restrictions Weight Bearing Restrictions: Yes (NWB RLE) RLE Weight Bearing: Non weight bearing   Pertinent Vitals/Pain **pt denies pain Premedicated, ice applied to R ankle, RLE elevated*      Mobility  Bed Mobility Bed Mobility: Supine to Sit Supine to Sit: 6: Modified independent (Device/Increase time);HOB flat Transfers Transfers: Sit to Stand;Stand to Sit Sit to Stand: 5: Supervision;From bed Stand to Sit: 5: Supervision;To chair/3-in-1 Details for Transfer Assistance: VCs hand placement Ambulation/Gait Ambulation/Gait Assistance: 5: Supervision Ambulation Distance (Feet): 40 Feet Assistive device: Rolling walker Stairs: Yes Stairs Assistance: 5: Supervision Stairs Assistance Details (indicate cue type and reason): VCs for technique Stair Management Technique: No rails;Backwards;With walker Number of Stairs: 1     Shoulder Instructions     Exercises     PT Diagnosis:    PT Problem List:   PT Treatment Interventions:     PT Goals    Visit Information  Last PT Received On: 10/12/12   Subjective Data  Subjective: I was using crutches but didn't feel steady with them.  Patient Stated Goal: to not injure this foot again   Prior Functioning  Home Living Lives With: Daughter Available Help at Discharge: Family Home Access: Stairs to enter Secretary/administrator of Steps: 1 Home Layout: One level Bathroom Shower/Tub: Engineer, manufacturing systems: Standard Home Adaptive Equipment: Crutches Prior Function Level of Independence: Independent Able to Take Stairs?: Yes Driving: Yes Vocation: Full time employment Communication Communication: No difficulties    Cognition  Overall Cognitive Status: Appears within functional limits for tasks assessed/performed Arousal/Alertness: Awake/alert Orientation Level: Appears intact for tasks assessed Behavior During Session: [facility] for tasks performed    Extremity/Trunk Assessment Right Upper Extremity Assessment RUE ROM/Strength/Tone: Within functional levels Left Upper Extremity Assessment LUE ROM/Strength/Tone: Within functional levels Right Lower Extremity Assessment RLE ROM/Strength/Tone: Within functional levels (hip and knee WFL, ankle NT) RLE Sensation: WFL - Light Touch RLE Coordination: WFL - gross/fine motor Left Lower Extremity Assessment LLE ROM/Strength/Tone: Within functional levels LLE Sensation: WFL - Light Touch LLE Coordination: WFL - gross/fine motor Trunk Assessment Trunk Assessment: Normal   Balance    End of Session PT - End of Session Activity Tolerance: Patient tolerated treatment well Patient left: in chair Nurse Communication: Mobility status  GP     Ralene Bathe Kistler 10/12/2012, 11:45 AM  [phone]

## 2012-10-12 NOTE — Care Management Note (Addendum)
    Page 1 of 1   10/12/2012     3:59:48 PM   CARE MANAGEMENT NOTE 10/12/2012  Patient:  Savannah Peterson, Savannah Peterson   Account Number:  [id number]  Date Initiated:  10/12/2012  Documentation initiated by:  Lanier Clam  Subjective/Objective Assessment:   ADMITTED W/FX R ANKLE.     Action/Plan:   FROM HOME   Anticipated DC Date:  10/12/2012   Anticipated DC Plan:  HOME/SELF CARE      DC Planning Services  CM consult      Choice offered to / List presented to:  C-1 Patient   DME arranged  Levan Hurst      DME agency  [facility]        Status of service:  Completed, signed off Medicare Important Message given?   (If response is "NO", the following Medicare IM given date fields will be blank) Date Medicare IM given:   Date Additional Medicare IM given:    Discharge Disposition:  HOME/SELF CARE  Per UR Regulation:    If discussed at Long Length of Stay Meetings, dates discussed:    Comments:  10/12/12 Ellasyn Swilling RN,BSN NCM [phone] [facility] JAMIE(DME REP) CONTACTED & AWARE OF RW-BROUGHT TO RM.ALSO ORDERED FOR KNEE WALKER WHICH PATIENT HAS TO GO TO RETAIL OFFICE TO PURCHASE.PROVIDED W/INFO OF ADDRESS:[address],[place] [phone],& COST OF KNEE WALKER,$[phone],PRIVATE PAY-EXPLAINED TO CALL/SEND TO HER WORKER'S COMP REP.

## 2012-10-12 NOTE — Discharge Summary (Signed)
Physician Discharge Summary  Patient ID: Savannah Peterson MRN: [id number] DOB/AGE: December 16, 1955 57 y.o.  Admit date: 10/11/2012 Discharge date: 10/12/2012  Admission Diagnoses: Lateral malleolar right ankle fracture.  Discharge Diagnoses:  Principal Problem:  *Fracture of right ankle, lateral malleolus   Discharged Condition: stable  Hospital Course: Patient's hospital course was essentially unremarkable. She underwent open reduction internal fixation lateral malleolar fracture. Postoperatively she progressed well was comfortable and was discharged to home in stable condition.  Consults: None  Significant Diagnostic Studies: labs: Routine labs  Treatments: surgery: See operative note  Discharge Exam: Blood pressure 132/82, pulse 77, temperature 97.6 F (36.4 C), temperature source Oral, resp. rate 16, height 5\' 7"  (1.702 m), weight 106.595 kg (235 lb), last menstrual period 09/07/2009, SpO2 99.00%. Incision/Wound: dressing clean dry and intact.  Disposition: 01-Home or Self Care  Discharge Orders    Future Orders Please Complete By Expires   Diet - low sodium heart healthy      Diet - low sodium heart healthy      Call MD / Call 911      Comments:   If you experience chest pain or shortness of breath, CALL 911 and be transported to the hospital emergency room.  If you develope a fever above 101 F, pus (white drainage) or increased drainage or redness at the wound, or calf pain, call your surgeon's office.   Constipation Prevention      Comments:   Drink plenty of fluids.  Prune juice may be helpful.  You may use a stool softener, such as Colace (over the counter) 100 mg twice a day.  Use MiraLax (over the counter) for constipation as needed.   Increase activity slowly as tolerated      Discharge instructions      Comments:   Elevation and ice for swelling. Keep your splint clean and dry. No weight at all on your right ankle.   Call MD / Call 911      Comments:   If you  experience chest pain or shortness of breath, CALL 911 and be transported to the hospital emergency room.  If you develope a fever above 101 F, pus (white drainage) or increased drainage or redness at the wound, or calf pain, call your surgeon's office.   Constipation Prevention      Comments:   Drink plenty of fluids.  Prune juice may be helpful.  You may use a stool softener, such as Colace (over the counter) 100 mg twice a day.  Use MiraLax (over the counter) for constipation as needed.   Increase activity slowly as tolerated          Medication List     As of 10/12/2012  9:22 AM    TAKE these medications         lisinopril-hydrochlorothiazide 20-25 MG per tablet   Commonly known as: PRINZIDE,ZESTORETIC   Take 1 tablet by mouth every morning.      methocarbamol 500 MG tablet   Commonly known as: ROBAXIN   Take 1 tablet (500 mg total) by mouth every 6 (six) hours as needed.      multivitamin with minerals Tabs   Take 1 tablet by mouth daily.      oxyCODONE-acetaminophen 5-325 MG per tablet   Commonly known as: PERCOCET/ROXICET   Take 2 tablets by mouth every 4 (four) hours as needed for pain.           Follow-up Information    Follow up with [facility]  Y, MD. In 2 weeks.   Contact information:   [address] [place] [address] [phone]          Signed: Nadara Mustard 10/12/2012, 9:22 AM

## 2012-10-12 NOTE — Progress Notes (Signed)
Discharged to family auto via wheelchair. Assessment unchanged from am. 

## 2012-10-15 ENCOUNTER — Encounter (HOSPITAL_COMMUNITY): Payer: Self-pay | Admitting: Orthopaedic Surgery

## 2012-12-05 ENCOUNTER — Ambulatory Visit: Payer: PRIVATE HEALTH INSURANCE | Attending: Orthopaedic Surgery

## 2012-12-05 DIAGNOSIS — M25569 Pain in unspecified knee: Secondary | ICD-10-CM | POA: Insufficient documentation

## 2012-12-05 DIAGNOSIS — M6281 Muscle weakness (generalized): Secondary | ICD-10-CM | POA: Insufficient documentation

## 2012-12-05 DIAGNOSIS — IMO0001 Reserved for inherently not codable concepts without codable children: Secondary | ICD-10-CM | POA: Insufficient documentation

## 2012-12-05 DIAGNOSIS — R262 Difficulty in walking, not elsewhere classified: Secondary | ICD-10-CM | POA: Insufficient documentation

## 2012-12-05 DIAGNOSIS — M25669 Stiffness of unspecified knee, not elsewhere classified: Secondary | ICD-10-CM | POA: Insufficient documentation

## 2012-12-12 ENCOUNTER — Ambulatory Visit: Payer: PRIVATE HEALTH INSURANCE

## 2012-12-16 ENCOUNTER — Ambulatory Visit: Payer: PRIVATE HEALTH INSURANCE

## 2012-12-18 ENCOUNTER — Ambulatory Visit: Payer: PRIVATE HEALTH INSURANCE

## 2012-12-26 ENCOUNTER — Ambulatory Visit: Payer: PRIVATE HEALTH INSURANCE | Admitting: Physical Therapy

## 2012-12-31 ENCOUNTER — Ambulatory Visit: Payer: PRIVATE HEALTH INSURANCE | Attending: Orthopaedic Surgery

## 2012-12-31 ENCOUNTER — Ambulatory Visit: Payer: PRIVATE HEALTH INSURANCE

## 2012-12-31 DIAGNOSIS — R262 Difficulty in walking, not elsewhere classified: Secondary | ICD-10-CM | POA: Insufficient documentation

## 2012-12-31 DIAGNOSIS — M25569 Pain in unspecified knee: Secondary | ICD-10-CM | POA: Insufficient documentation

## 2012-12-31 DIAGNOSIS — IMO0001 Reserved for inherently not codable concepts without codable children: Secondary | ICD-10-CM | POA: Insufficient documentation

## 2012-12-31 DIAGNOSIS — M25669 Stiffness of unspecified knee, not elsewhere classified: Secondary | ICD-10-CM | POA: Insufficient documentation

## 2012-12-31 DIAGNOSIS — M6281 Muscle weakness (generalized): Secondary | ICD-10-CM | POA: Insufficient documentation

## 2013-01-02 ENCOUNTER — Ambulatory Visit: Payer: PRIVATE HEALTH INSURANCE

## 2013-01-07 ENCOUNTER — Ambulatory Visit: Payer: PRIVATE HEALTH INSURANCE | Admitting: Physical Therapy

## 2013-01-09 ENCOUNTER — Ambulatory Visit: Payer: PRIVATE HEALTH INSURANCE

## 2013-01-14 ENCOUNTER — Ambulatory Visit: Payer: PRIVATE HEALTH INSURANCE | Admitting: Physical Therapy

## 2013-01-16 ENCOUNTER — Ambulatory Visit: Payer: PRIVATE HEALTH INSURANCE | Admitting: Physical Therapy

## 2013-01-22 ENCOUNTER — Ambulatory Visit: Payer: PRIVATE HEALTH INSURANCE | Admitting: Physical Therapy

## 2013-01-24 ENCOUNTER — Ambulatory Visit: Payer: PRIVATE HEALTH INSURANCE

## 2013-01-28 ENCOUNTER — Ambulatory Visit: Payer: PRIVATE HEALTH INSURANCE | Admitting: Physical Therapy

## 2013-01-30 ENCOUNTER — Ambulatory Visit: Payer: PRIVATE HEALTH INSURANCE | Attending: Orthopaedic Surgery | Admitting: Physical Therapy

## 2013-01-30 DIAGNOSIS — M6281 Muscle weakness (generalized): Secondary | ICD-10-CM | POA: Insufficient documentation

## 2013-01-30 DIAGNOSIS — R262 Difficulty in walking, not elsewhere classified: Secondary | ICD-10-CM | POA: Insufficient documentation

## 2013-01-30 DIAGNOSIS — M25669 Stiffness of unspecified knee, not elsewhere classified: Secondary | ICD-10-CM | POA: Insufficient documentation

## 2013-01-30 DIAGNOSIS — IMO0001 Reserved for inherently not codable concepts without codable children: Secondary | ICD-10-CM | POA: Insufficient documentation

## 2013-01-30 DIAGNOSIS — M25569 Pain in unspecified knee: Secondary | ICD-10-CM | POA: Insufficient documentation

## 2013-02-04 ENCOUNTER — Ambulatory Visit: Payer: PRIVATE HEALTH INSURANCE | Admitting: Physical Therapy

## 2013-02-06 ENCOUNTER — Ambulatory Visit: Payer: PRIVATE HEALTH INSURANCE | Admitting: Physical Therapy

## 2013-02-14 ENCOUNTER — Ambulatory Visit: Payer: PRIVATE HEALTH INSURANCE

## 2013-02-18 ENCOUNTER — Encounter: Payer: PRIVATE HEALTH INSURANCE | Admitting: Rehabilitation

## 2013-02-25 ENCOUNTER — Ambulatory Visit: Payer: PRIVATE HEALTH INSURANCE

## 2013-02-27 ENCOUNTER — Ambulatory Visit: Payer: PRIVATE HEALTH INSURANCE

## 2013-03-03 ENCOUNTER — Ambulatory Visit: Payer: PRIVATE HEALTH INSURANCE | Attending: Orthopaedic Surgery

## 2013-03-03 DIAGNOSIS — IMO0001 Reserved for inherently not codable concepts without codable children: Secondary | ICD-10-CM | POA: Insufficient documentation

## 2013-03-03 DIAGNOSIS — M25569 Pain in unspecified knee: Secondary | ICD-10-CM | POA: Insufficient documentation

## 2013-03-03 DIAGNOSIS — R262 Difficulty in walking, not elsewhere classified: Secondary | ICD-10-CM | POA: Insufficient documentation

## 2013-03-03 DIAGNOSIS — M25669 Stiffness of unspecified knee, not elsewhere classified: Secondary | ICD-10-CM | POA: Insufficient documentation

## 2013-03-03 DIAGNOSIS — M6281 Muscle weakness (generalized): Secondary | ICD-10-CM | POA: Insufficient documentation

## 2013-03-06 ENCOUNTER — Ambulatory Visit: Payer: PRIVATE HEALTH INSURANCE | Admitting: Rehabilitation

## 2013-03-10 ENCOUNTER — Ambulatory Visit: Payer: PRIVATE HEALTH INSURANCE | Admitting: Physical Therapy

## 2013-03-12 ENCOUNTER — Ambulatory Visit: Payer: PRIVATE HEALTH INSURANCE | Admitting: Physical Therapy

## 2013-03-17 ENCOUNTER — Ambulatory Visit: Payer: PRIVATE HEALTH INSURANCE | Admitting: Physical Therapy

## 2013-03-19 ENCOUNTER — Ambulatory Visit: Payer: PRIVATE HEALTH INSURANCE | Admitting: Physical Therapy

## 2013-04-25 ENCOUNTER — Ambulatory Visit (INDEPENDENT_AMBULATORY_CARE_PROVIDER_SITE_OTHER): Payer: 59 | Admitting: Emergency Medicine

## 2013-04-25 VITALS — BP 142/90 | HR 74 | Temp 98.7°F | Resp 16 | Ht 66.5 in | Wt 236.0 lb

## 2013-04-25 DIAGNOSIS — N72 Inflammatory disease of cervix uteri: Secondary | ICD-10-CM

## 2013-04-25 DIAGNOSIS — B9689 Other specified bacterial agents as the cause of diseases classified elsewhere: Secondary | ICD-10-CM

## 2013-04-25 DIAGNOSIS — N3090 Cystitis, unspecified without hematuria: Secondary | ICD-10-CM

## 2013-04-25 DIAGNOSIS — N760 Acute vaginitis: Secondary | ICD-10-CM

## 2013-04-25 DIAGNOSIS — N898 Other specified noninflammatory disorders of vagina: Secondary | ICD-10-CM

## 2013-04-25 LAB — POCT WET PREP WITH KOH
KOH Prep POC: NEGATIVE
RBC Wet Prep HPF POC: NEGATIVE

## 2013-04-25 LAB — POCT URINALYSIS DIPSTICK
Bilirubin, UA: NEGATIVE
Glucose, UA: NEGATIVE
Leukocytes, UA: NEGATIVE
Nitrite, UA: NEGATIVE
Urobilinogen, UA: 0.2

## 2013-04-25 LAB — POCT UA - MICROSCOPIC ONLY
Casts, Ur, LPF, POC: NEGATIVE
Yeast, UA: NEGATIVE

## 2013-04-25 MED ORDER — CIPROFLOXACIN HCL 500 MG PO TABS: 500.0000 mg | ORAL_TABLET | Freq: Two times a day (BID) | ORAL | Status: DC

## 2013-04-25 MED ORDER — METRONIDAZOLE 500 MG PO TABS: 500.0000 mg | ORAL_TABLET | Freq: Two times a day (BID) | ORAL | Status: DC

## 2013-04-25 MED ORDER — PHENAZOPYRIDINE HCL 200 MG PO TABS
200.0000 mg | ORAL_TABLET | Freq: Three times a day (TID) | ORAL | Status: DC | PRN
Start: 2013-04-25 — End: 2014-01-15
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
## 2013-04-25 NOTE — Patient Instructions (Addendum)
Bacterial Vaginosis Bacterial vaginosis (BV) is a vaginal infection where the normal balance of bacteria in the vagina is disrupted. The normal balance is then replaced by an overgrowth of certain bacteria. There are several different kinds of bacteria that can cause BV. BV is the most common vaginal infection in women of childbearing age. CAUSES   The cause of BV is not fully understood. BV develops when there is an increase or imbalance of harmful bacteria.  Some activities or behaviors can upset the normal balance of bacteria in the vagina and put women at increased risk including:  Having a new sex partner or multiple sex partners.  Douching.  Using an intrauterine device (IUD) for contraception.  It is not clear what role sexual activity plays in the development of BV. However, women that have never had sexual intercourse are rarely infected with BV. Women do not get BV from toilet seats, bedding, swimming pools or from touching objects around them.  SYMPTOMS   Grey vaginal discharge.  A fish-like odor with discharge, especially after sexual intercourse.  Itching or burning of the vagina and vulva.  Burning or pain with urination.  Some women have no signs or symptoms at all. DIAGNOSIS  Your caregiver must examine the vagina for signs of BV. Your caregiver will perform lab tests and look at the sample of vaginal fluid through a microscope. They will look for bacteria and abnormal cells (clue cells), a pH test higher than 4.5, and a positive amine test all associated with BV.  RISKS AND COMPLICATIONS   Pelvic inflammatory disease (PID).  Infections following gynecology surgery.  Developing HIV.  Developing herpes virus. TREATMENT  Sometimes BV will clear up without treatment. However, all women with symptoms of BV should be treated to avoid complications, especially if gynecology surgery is planned. Female partners generally do not need to be treated. However, BV may spread  between female sex partners so treatment is helpful in preventing a recurrence of BV.   BV may be treated with antibiotics. The antibiotics come in either pill or vaginal cream forms. Either can be used with nonpregnant or pregnant women, but the recommended dosages differ. These antibiotics are not harmful to the baby.  BV can recur after treatment. If this happens, a second round of antibiotics will often be prescribed.  Treatment is important for pregnant women. If not treated, BV can cause a premature delivery, especially for a pregnant woman who had a premature birth in the past. All pregnant women who have symptoms of BV should be checked and treated.  For chronic reoccurrence of BV, treatment with a type of prescribed gel vaginally twice a week is helpful. HOME CARE INSTRUCTIONS   Finish all medication as directed by your caregiver.  Do not have sex until treatment is completed.  Tell your sexual partner that you have a vaginal infection. They should see their caregiver and be treated if they have problems, such as a mild rash or itching.  Practice safe sex. Use condoms. Only have 1 sex partner. PREVENTION  Basic prevention steps can help reduce the risk of upsetting the natural balance of bacteria in the vagina and developing BV:  Do not have sexual intercourse (be abstinent).  Do not douche.  Use all of the medicine prescribed for treatment of BV, even if the signs and symptoms go away.  Tell your sex partner if you have BV. That way, they can be treated, if needed, to prevent reoccurrence. SEEK MEDICAL CARE IF:     Your symptoms are not improving after 3 days of treatment.  You have increased discharge, pain, or fever. MAKE SURE YOU:   Understand these instructions.  Will watch your condition.  Will get help right away if you are not doing well or get worse. FOR MORE INFORMATION  Division of STD Prevention (DSTDP), Centers for Disease Control and Prevention:  www.cdc.gov/std American [employer] Health Association (ASHA): www.ashastd.org  Document Released: 09/18/2005 Document Revised: 12/11/2011 Document Reviewed: 03/11/2009 [employer] Patient Information 2014 [employer].  

## 2013-04-27 LAB — GC/CHLAMYDIA PROBE AMP
CT Probe RNA: NEGATIVE
GC Probe RNA: NEGATIVE

## 2013-10-02 ENCOUNTER — Emergency Department (HOSPITAL_BASED_OUTPATIENT_CLINIC_OR_DEPARTMENT_OTHER)
Admission: EM | Admit: 2013-10-02 | Discharge: 2013-10-02 | Disposition: A | Discharge status: Home / Self Care | Payer: 59 | Attending: Emergency Medicine | Admitting: Emergency Medicine

## 2013-10-02 ENCOUNTER — Encounter (HOSPITAL_BASED_OUTPATIENT_CLINIC_OR_DEPARTMENT_OTHER): Payer: Self-pay | Admitting: Emergency Medicine

## 2013-10-02 DIAGNOSIS — Z792 Long term (current) use of antibiotics: Secondary | ICD-10-CM | POA: Insufficient documentation

## 2013-10-02 DIAGNOSIS — I10 Essential (primary) hypertension: Secondary | ICD-10-CM | POA: Insufficient documentation

## 2013-10-02 DIAGNOSIS — Z880 Allergy status to penicillin: Secondary | ICD-10-CM | POA: Insufficient documentation

## 2013-10-02 DIAGNOSIS — Z79899 Other long term (current) drug therapy: Secondary | ICD-10-CM | POA: Insufficient documentation

## 2013-10-02 DIAGNOSIS — N390 Urinary tract infection, site not specified: Secondary | ICD-10-CM | POA: Insufficient documentation

## 2013-10-02 LAB — URINALYSIS, ROUTINE W REFLEX MICROSCOPIC
Bilirubin Urine: NEGATIVE
GLUCOSE, UA: NEGATIVE mg/dL
KETONES UR: 15 mg/dL — AB
Nitrite: NEGATIVE
PH: 6 (ref 5.0–8.0)
Protein, ur: 100 mg/dL — AB
Specific Gravity, Urine: 1.02 (ref 1.005–1.030)
Urobilinogen, UA: 0.2 mg/dL (ref 0.0–1.0)

## 2013-10-02 LAB — URINE MICROSCOPIC-ADD ON

## 2013-10-02 MED ORDER — PHENAZOPYRIDINE HCL 200 MG PO TABS: 200.0000 mg | ORAL_TABLET | Freq: Three times a day (TID) | ORAL | Status: DC | PRN

## 2013-10-02 MED ORDER — CIPROFLOXACIN HCL 500 MG PO TABS: 500.0000 mg | ORAL_TABLET | Freq: Two times a day (BID) | ORAL | Status: DC

## 2013-10-02 NOTE — ED Provider Notes (Signed)
CSN: [id number]     Arrival date & time 10/02/13  0718 History   First MD Initiated Contact with Patient 10/02/13 [phone]     Chief Complaint  Patient presents with  . Urinary Frequency  . Hematuria   (Consider location/radiation/quality/duration/timing/severity/associated sxs/prior Treatment) HPI Comments: Patient is a 58 year old female who presents with complaints of urinary frequency for the past 3 days. She denies burning but has noticed when she wipes there is a small amount of blood present. She states that she has had urinary tract infections in the past. She denies fevers, chills, vomiting, abdominal pain, or bowel complaints.  Patient is a 58 y.o. female presenting with frequency. The history is provided by the patient.  Urinary Frequency This is a new problem. The current episode started 2 days ago. The problem occurs constantly. The problem has been gradually worsening. Nothing aggravates the symptoms. Nothing relieves the symptoms. She has tried nothing for the symptoms. The treatment provided no relief.    Past Medical History  Diagnosis Date  . Hypertension    Past Surgical History  Procedure Laterality Date  . Tubal ligation  1982  . Orif ankle fracture  10/11/2012    Procedure: OPEN REDUCTION INTERNAL FIXATION (ORIF) ANKLE FRACTURE;  Surgeon: Kathryne Hitch, MD;  Location: [facility];  Service: Orthopedics;  Laterality: Right;  Open Reduction Internal Fixation Right Ankle Fracture   No family history on file. History  Substance Use Topics  . Smoking status: Never Smoker   . Smokeless tobacco: Never Used  . Alcohol Use: No   OB History   Grav Para Term Preterm Abortions TAB SAB Ect Mult Living                 Review of Systems  Genitourinary: Positive for frequency.  All other systems reviewed and are negative.    Allergies  Penicillins  Home Medications   Current Outpatient Rx  Name  Route  Sig  Dispense  Refill  . ciprofloxacin (CIPRO) 500 MG  tablet   Oral   Take 1 tablet (500 mg total) by mouth 2 (two) times daily.   6 tablet   0   . lisinopril-hydrochlorothiazide (PRINZIDE,ZESTORETIC) 20-25 MG per tablet   Oral   Take 1 tablet by mouth every morning.          . methocarbamol (ROBAXIN) 500 MG tablet   Oral   Take 1 tablet (500 mg total) by mouth every 6 (six) hours as needed.   30 tablet   0   . metroNIDAZOLE (FLAGYL) 500 MG tablet   Oral   Take 1 tablet (500 mg total) by mouth 2 (two) times daily with a meal. DO NOT CONSUME ALCOHOL WHILE TAKING THIS MEDICATION.   14 tablet   0   . Multiple Vitamin (MULTIVITAMIN WITH MINERALS) TABS   Oral   Take 1 tablet by mouth daily.         Marland Kitchen oxyCODONE-acetaminophen (PERCOCET/ROXICET) 5-325 MG per tablet   Oral   Take 2 tablets by mouth every 4 (four) hours as needed for pain.   60 tablet   0   . phenazopyridine (PYRIDIUM) 200 MG tablet   Oral   Take 1 tablet (200 mg total) by mouth 3 (three) times daily as needed for pain.   10 tablet   0    BP 155/75  Pulse 64  Temp(Src) 97.8 F (36.6 C) (Oral)  Resp 18  Ht 5\' 7"  (1.702 m)  Wt 210 lb (  95.255 kg)  BMI 32.88 kg/m2  SpO2 100%  LMP 09/07/2009 Physical Exam  Nursing note and vitals reviewed. Constitutional: She is oriented to person, place, and time. She appears well-developed and well-nourished. No distress.  HENT:  Head: Normocephalic and atraumatic.  Neck: Normal range of motion. Neck supple.  Cardiovascular: Normal rate and regular rhythm.  Exam reveals no gallop and no friction rub.   No murmur heard. Pulmonary/Chest: Effort normal and breath sounds normal. No respiratory distress. She has no wheezes.  Abdominal: Soft. Bowel sounds are normal. She exhibits no distension. There is no tenderness.  Musculoskeletal: Normal range of motion.  Neurological: She is alert and oriented to person, place, and time.  Skin: Skin is warm and dry. She is not diaphoretic.    ED Course  Procedures (including  critical care time) Labs Review Labs Reviewed  URINALYSIS, ROUTINE W REFLEX MICROSCOPIC   Imaging Review No results found.    MDM  No diagnosis found. Urinalysis reveals too numerous to count whites and many bacteria. We'll treat with Cipro and pyridium. Return when necessary    Geoffery Lyonsouglas Ted Leonhart, MD 10/02/13 [phone]

## 2013-10-02 NOTE — ED Notes (Signed)
Pt c/o urinary frequency and hematuria x 3 days. Denies pain or fever.

## 2013-10-02 NOTE — Discharge Instructions (Signed)
Cipro and pyridium as prescribed.  Return to the emergency department for severe abdominal pain, high fever, chills, vomiting or other new or concerning symptoms.   Urinary Tract Infection Urinary tract infections (UTIs) can develop anywhere along your urinary tract. Your urinary tract is your body's drainage system for removing wastes and extra water. Your urinary tract includes two kidneys, two ureters, a bladder, and a urethra. Your kidneys are a pair of bean-shaped organs. Each kidney is about the size of your fist. They are located below your ribs, one on each side of your spine. CAUSES Infections are caused by microbes, which are microscopic organisms, including fungi, viruses, and bacteria. These organisms are so small that they can only be seen through a microscope. Bacteria are the microbes that most commonly cause UTIs. SYMPTOMS  Symptoms of UTIs may vary by age and gender of the patient and by the location of the infection. Symptoms in young women typically include a frequent and intense urge to urinate and a painful, burning feeling in the bladder or urethra during urination. Older women and men are more likely to be tired, shaky, and weak and have muscle aches and abdominal pain. A fever may mean the infection is in your kidneys. Other symptoms of a kidney infection include pain in your back or sides below the ribs, nausea, and vomiting. DIAGNOSIS To diagnose a UTI, your caregiver will ask you about your symptoms. Your caregiver also will ask to provide a urine sample. The urine sample will be tested for bacteria and white blood cells. White blood cells are made by your body to help fight infection. TREATMENT  Typically, UTIs can be treated with medication. Because most UTIs are caused by a bacterial infection, they usually can be treated with the use of antibiotics. The choice of antibiotic and length of treatment depend on your symptoms and the type of bacteria causing your  infection. HOME CARE INSTRUCTIONS  If you were prescribed antibiotics, take them exactly as your caregiver instructs you. Finish the medication even if you feel better after you have only taken some of the medication.  Drink enough water and fluids to keep your urine clear or pale yellow.  Avoid caffeine, tea, and carbonated beverages. They tend to irritate your bladder.  Empty your bladder often. Avoid holding urine for long periods of time.  Empty your bladder before and after sexual intercourse.  After a bowel movement, women should cleanse from front to back. Use each tissue only once. SEEK MEDICAL CARE IF:   You have back pain.  You develop a fever.  Your symptoms do not begin to resolve within 3 days. SEEK IMMEDIATE MEDICAL CARE IF:   You have severe back pain or lower abdominal pain.  You develop chills.  You have nausea or vomiting.  You have continued burning or discomfort with urination. MAKE SURE YOU:   Understand these instructions.  Will watch your condition.  Will get help right away if you are not doing well or get worse. Document Released: 06/28/2005 Document Revised: 03/19/2012 Document Reviewed: 10/27/2011 [facility] Patient Information 2014 [place], [place].

## 2013-10-04 LAB — URINE CULTURE

## 2013-10-05 ENCOUNTER — Telehealth (HOSPITAL_COMMUNITY): Payer: Self-pay | Admitting: Emergency Medicine

## 2013-10-05 NOTE — ED Notes (Signed)
Post ED Visit - Positive Culture Follow-up  Culture report reviewed by antimicrobial stewardship pharmacist: [x]  Wes Dulaney, Pharm.D., BCPS []  Celedonio MiyamotoJeremy Frens, Pharm.D., BCPS []  Georgina PillionElizabeth Martin, Pharm.D., BCPS []  [place] CreekMinh Pham, VermontPharm.D., BCPS, AAHIVP []  Estella HuskMichelle Turner, Pharm.D., BCPS, AAHIVP  Positive urine culture Treated with Cipro, organism sensitive to the same and no further patient follow-up is required at this time.  [place], Jenel LucksKylie 10/05/2013, 5:59 PM

## 2014-01-14 ENCOUNTER — Other Ambulatory Visit (HOSPITAL_COMMUNITY): Payer: Self-pay | Admitting: Orthopaedic Surgery

## 2014-01-14 ENCOUNTER — Encounter (HOSPITAL_COMMUNITY): Payer: Self-pay | Admitting: *Deleted

## 2014-01-14 NOTE — Progress Notes (Signed)
[facility]. Has not seen them in over a year.   Called Dr. Eliberto IvoryBlackman's office to verify that pt is having surgery here tomorrow. Pt states she thought she was having it at [facility] and had already talked with them today. I spoke with Sherrie, scheduler for Dr. Magnus IvanBlackman and she states pt is having surgery here and she had left a message for pt to return call and she hadn't done so yet. Also asked Sherrie to have Dr. Magnus IvanBlackman sign his orders.

## 2014-01-14 NOTE — Progress Notes (Signed)
Called pt back and told her that she is definitely having surgery here tomorrow. She voiced understanding.

## 2014-01-15 ENCOUNTER — Encounter (HOSPITAL_COMMUNITY): Payer: PRIVATE HEALTH INSURANCE | Admitting: Anesthesiology

## 2014-01-15 ENCOUNTER — Encounter (HOSPITAL_COMMUNITY): Payer: Self-pay | Admitting: *Deleted

## 2014-01-15 ENCOUNTER — Ambulatory Visit (HOSPITAL_COMMUNITY)
Admission: RE | Admit: 2014-01-15 | Discharge: 2014-01-15 | Disposition: A | Discharge status: Home / Self Care | Payer: PRIVATE HEALTH INSURANCE | Source: Ambulatory Visit | Attending: Orthopaedic Surgery | Admitting: Orthopaedic Surgery

## 2014-01-15 ENCOUNTER — Encounter (HOSPITAL_COMMUNITY)
Admission: RE | Disposition: A | Discharge status: Home / Self Care | Payer: Self-pay | Source: Ambulatory Visit | Attending: Orthopaedic Surgery

## 2014-01-15 ENCOUNTER — Ambulatory Visit (HOSPITAL_COMMUNITY): Payer: PRIVATE HEALTH INSURANCE

## 2014-01-15 ENCOUNTER — Ambulatory Visit (HOSPITAL_COMMUNITY): Payer: PRIVATE HEALTH INSURANCE | Admitting: Anesthesiology

## 2014-01-15 DIAGNOSIS — M25676 Stiffness of unspecified foot, not elsewhere classified: Principal | ICD-10-CM | POA: Insufficient documentation

## 2014-01-15 DIAGNOSIS — M12579 Traumatic arthropathy, unspecified ankle and foot: Secondary | ICD-10-CM | POA: Insufficient documentation

## 2014-01-15 DIAGNOSIS — S8261XA Displaced fracture of lateral malleolus of right fibula, initial encounter for closed fracture: Secondary | ICD-10-CM

## 2014-01-15 DIAGNOSIS — M25673 Stiffness of unspecified ankle, not elsewhere classified: Secondary | ICD-10-CM | POA: Insufficient documentation

## 2014-01-15 DIAGNOSIS — I10 Essential (primary) hypertension: Secondary | ICD-10-CM | POA: Insufficient documentation

## 2014-01-15 DIAGNOSIS — Z981 Arthrodesis status: Secondary | ICD-10-CM | POA: Insufficient documentation

## 2014-01-15 DIAGNOSIS — M19071 Primary osteoarthritis, right ankle and foot: Secondary | ICD-10-CM

## 2014-01-15 HISTORY — PX: HARDWARE REMOVAL: SHX979

## 2014-01-15 HISTORY — PX: ANKLE ARTHROSCOPY: SHX545

## 2014-01-15 LAB — CBC
HCT: 40.3 % (ref 36.0–46.0)
Hemoglobin: 13.5 g/dL (ref 12.0–15.0)
MCH: 32 pg (ref 26.0–34.0)
MCHC: 33.5 g/dL (ref 30.0–36.0)
MCV: 95.5 fL (ref 78.0–100.0)
Platelets: 324 10*3/uL (ref 150–400)
RBC: 4.22 MIL/uL (ref 3.87–5.11)
RDW: 13.8 % (ref 11.5–15.5)
WBC: 7.2 10*3/uL (ref 4.0–10.5)

## 2014-01-15 LAB — BASIC METABOLIC PANEL
BUN: 14 mg/dL (ref 6–23)
CO2: 26 mEq/L (ref 19–32)
Calcium: 9.7 mg/dL (ref 8.4–10.5)
Chloride: 102 mEq/L (ref 96–112)
Creatinine, Ser: 0.86 mg/dL (ref 0.50–1.10)
GFR, EST AFRICAN AMERICAN: 85 mL/min — AB (ref >90)
GFR, EST NON AFRICAN AMERICAN: 73 mL/min — AB (ref >90)
Glucose, Bld: 104 mg/dL — ABNORMAL HIGH (ref 70–99)
POTASSIUM: 4.4 meq/L (ref 3.7–5.3)
SODIUM: 142 meq/L (ref 137–147)

## 2014-01-15 SURGERY — ARTHROSCOPY, ANKLE
Anesthesia: General | Site: Ankle | Laterality: Right

## 2014-01-15 MED ORDER — MIDAZOLAM HCL 5 MG/5ML IJ SOLN
INTRAMUSCULAR | Status: DC | PRN
  Administered 2014-01-15: 1 mg via INTRAVENOUS

## 2014-01-15 MED ORDER — ROCURONIUM BROMIDE 50 MG/5ML IV SOLN
INTRAVENOUS | Status: AC
  Filled 2014-01-15: qty 1

## 2014-01-15 MED ORDER — PROPOFOL 10 MG/ML IV BOLUS
INTRAVENOUS | Status: AC
  Filled 2014-01-15: qty 20

## 2014-01-15 MED ORDER — FENTANYL CITRATE 0.05 MG/ML IJ SOLN
INTRAMUSCULAR | Status: DC | PRN
  Administered 2014-01-15 (×4): 50 ug via INTRAVENOUS

## 2014-01-15 MED ORDER — LIDOCAINE-EPINEPHRINE (PF) 1.5 %-1:200000 IJ SOLN
INTRAMUSCULAR | Status: DC | PRN
  Administered 2014-01-15: 30 mL via PERINEURAL

## 2014-01-15 MED ORDER — SODIUM CHLORIDE 0.9 % IR SOLN
Status: DC | PRN
  Administered 2014-01-15: 6000 mL

## 2014-01-15 MED ORDER — CLINDAMYCIN PHOSPHATE 900 MG/50ML IV SOLN
900.0000 mg | INTRAVENOUS | Status: AC
  Administered 2014-01-15: 900 mg via INTRAVENOUS
  Filled 2014-01-15: qty 50

## 2014-01-15 MED ORDER — MIDAZOLAM HCL 2 MG/2ML IJ SOLN
INTRAMUSCULAR | Status: AC
  Filled 2014-01-15: qty 2

## 2014-01-15 MED ORDER — FENTANYL CITRATE 0.05 MG/ML IJ SOLN
INTRAMUSCULAR | Status: AC
  Filled 2014-01-15: qty 5

## 2014-01-15 MED ORDER — ONDANSETRON HCL 4 MG/2ML IJ SOLN
INTRAMUSCULAR | Status: AC
  Filled 2014-01-15: qty 2

## 2014-01-15 MED ORDER — MIDAZOLAM HCL 5 MG/ML IJ SOLN: 2.0000 mg | Freq: Once | INTRAMUSCULAR | Status: DC

## 2014-01-15 MED ORDER — BUPIVACAINE-EPINEPHRINE PF 0.5-1:200000 % IJ SOLN
INTRAMUSCULAR | Status: DC | PRN
  Administered 2014-01-15: 30 mL via PERINEURAL

## 2014-01-15 MED ORDER — OXYCODONE-ACETAMINOPHEN 5-325 MG PO TABS: 1.0000 | ORAL_TABLET | ORAL | Status: DC | PRN

## 2014-01-15 MED ORDER — FENTANYL CITRATE 0.05 MG/ML IJ SOLN
INTRAMUSCULAR | Status: AC
  Administered 2014-01-15: 50 ug via INTRAVENOUS
  Filled 2014-01-15: qty 2

## 2014-01-15 MED ORDER — PROMETHAZINE HCL 12.5 MG PO TABS: 12.5000 mg | ORAL_TABLET | Freq: Four times a day (QID) | ORAL | Status: DC | PRN

## 2014-01-15 MED ORDER — PROPOFOL 10 MG/ML IV BOLUS
INTRAVENOUS | Status: DC | PRN
  Administered 2014-01-15: 150 mg via INTRAVENOUS

## 2014-01-15 MED ORDER — LACTATED RINGERS IV SOLN
INTRAVENOUS | Status: DC
  Administered 2014-01-15: 09:00:00 via INTRAVENOUS

## 2014-01-15 MED ORDER — LIDOCAINE HCL (CARDIAC) 20 MG/ML IV SOLN
INTRAVENOUS | Status: AC
  Filled 2014-01-15: qty 5

## 2014-01-15 MED ORDER — FENTANYL CITRATE 0.05 MG/ML IJ SOLN: 50.0000 ug | Freq: Once | INTRAMUSCULAR | Status: DC

## 2014-01-15 MED ORDER — PHENYLEPHRINE HCL 10 MG/ML IJ SOLN
INTRAMUSCULAR | Status: DC | PRN
  Administered 2014-01-15: 80 ug via INTRAVENOUS

## 2014-01-15 MED ORDER — MIDAZOLAM HCL 2 MG/2ML IJ SOLN
INTRAMUSCULAR | Status: AC
  Administered 2014-01-15: 2 mg
  Filled 2014-01-15: qty 2

## 2014-01-15 MED ORDER — ONDANSETRON HCL 4 MG/2ML IJ SOLN
INTRAMUSCULAR | Status: DC | PRN
  Administered 2014-01-15: 4 mg via INTRAVENOUS

## 2014-01-15 MED ORDER — LIDOCAINE HCL (CARDIAC) 10 MG/ML IV SOLN
INTRAVENOUS | Status: DC | PRN
  Administered 2014-01-15: 100 mg via INTRAVENOUS

## 2014-01-15 SURGICAL SUPPLY — 64 items
BANDAGE ELASTIC 4 VELCRO ST LF (GAUZE/BANDAGES/DRESSINGS) ×1 IMPLANT
BANDAGE ELASTIC 6 VELCRO ST LF (GAUZE/BANDAGES/DRESSINGS) ×2 IMPLANT
BANDAGE ESMARK 6X9 LF (GAUZE/BANDAGES/DRESSINGS) IMPLANT
BANDAGE GAUZE ELAST BULKY 4 IN (GAUZE/BANDAGES/DRESSINGS) ×2 IMPLANT
BLADE CUTTER GATOR 3.5 (BLADE) ×3 IMPLANT
BLADE SURG 11 STRL SS (BLADE) IMPLANT
BLADE SURG ROTATE 9660 (MISCELLANEOUS) IMPLANT
BNDG CMPR 9X6 STRL LF SNTH (GAUZE/BANDAGES/DRESSINGS)
BNDG COHESIVE 4X5 TAN STRL (GAUZE/BANDAGES/DRESSINGS) IMPLANT
BNDG ESMARK 6X9 LF (GAUZE/BANDAGES/DRESSINGS)
BNDG GAUZE ELAST 4 BULKY (GAUZE/BANDAGES/DRESSINGS) ×1 IMPLANT
BUR GATOR 2.9 (BURR) ×1 IMPLANT
COVER SURGICAL LIGHT HANDLE (MISCELLANEOUS) ×2 IMPLANT
CUFF TOURNIQUET SINGLE 34IN LL (TOURNIQUET CUFF) IMPLANT
CUFF TOURNIQUET SINGLE 44IN (TOURNIQUET CUFF) IMPLANT
DRAPE ARTHROSCOPY W/POUCH 114 (DRAPES) ×2 IMPLANT
DRAPE C-ARM 42X72 X-RAY (DRAPES) IMPLANT
DRAPE EXTREMITY T 121X128X90 (DRAPE) IMPLANT
DRAPE INCISE IOBAN 66X45 STRL (DRAPES) IMPLANT
DRAPE ORTHO SPLIT 77X108 STRL (DRAPES)
DRAPE PROXIMA HALF (DRAPES) IMPLANT
DRAPE SURG ORHT 6 SPLT 77X108 (DRAPES) IMPLANT
DRAPE U-SHAPE 47X51 STRL (DRAPES) ×2 IMPLANT
DRSG EMULSION OIL 3X3 NADH (GAUZE/BANDAGES/DRESSINGS) ×2 IMPLANT
DRSG PAD ABDOMINAL 8X10 ST (GAUZE/BANDAGES/DRESSINGS) ×2 IMPLANT
DURAPREP 26ML APPLICATOR (WOUND CARE) ×2 IMPLANT
ELECT REM PT RETURN 9FT ADLT (ELECTROSURGICAL) ×2
ELECTRODE REM PT RTRN 9FT ADLT (ELECTROSURGICAL) ×1 IMPLANT
GAUZE XEROFORM 1X8 LF (GAUZE/BANDAGES/DRESSINGS) ×2 IMPLANT
GAUZE XEROFORM 5X9 LF (GAUZE/BANDAGES/DRESSINGS) ×1 IMPLANT
GLOVE BIO SURGEON STRL SZ8 (GLOVE) ×2 IMPLANT
GLOVE BIOGEL PI IND STRL 8 (GLOVE) ×1 IMPLANT
GLOVE BIOGEL PI INDICATOR 8 (GLOVE) ×1
GLOVE ORTHO TXT STRL SZ7.5 (GLOVE) ×2 IMPLANT
GOWN STRL REUS W/ TWL LRG LVL3 (GOWN DISPOSABLE) ×3 IMPLANT
GOWN STRL REUS W/ TWL XL LVL3 (GOWN DISPOSABLE) ×4 IMPLANT
GOWN STRL REUS W/TWL LRG LVL3 (GOWN DISPOSABLE) ×6
GOWN STRL REUS W/TWL XL LVL3 (GOWN DISPOSABLE) ×8
KIT BASIN OR (CUSTOM PROCEDURE TRAY) ×2 IMPLANT
KIT ROOM TURNOVER OR (KITS) ×2 IMPLANT
MANIFOLD NEPTUNE II (INSTRUMENTS) ×2 IMPLANT
NS IRRIG 1000ML POUR BTL (IV SOLUTION) ×2 IMPLANT
PACK ARTHROSCOPY DSU (CUSTOM PROCEDURE TRAY) ×2 IMPLANT
PACK GENERAL/GYN (CUSTOM PROCEDURE TRAY) ×2 IMPLANT
PAD ARMBOARD 7.5X6 YLW CONV (MISCELLANEOUS) ×4 IMPLANT
PAD CAST 4YDX4 CTTN HI CHSV (CAST SUPPLIES) ×1 IMPLANT
PADDING CAST COTTON 4X4 STRL (CAST SUPPLIES) ×2
PADDING CAST COTTON 6X4 STRL (CAST SUPPLIES) ×2 IMPLANT
SET ARTHROSCOPY TUBING (MISCELLANEOUS) ×2
SET ARTHROSCOPY TUBING LN (MISCELLANEOUS) ×1 IMPLANT
SPONGE GAUZE 4X4 12PLY (GAUZE/BANDAGES/DRESSINGS) ×2 IMPLANT
SPONGE GAUZE 4X4 12PLY STER LF (GAUZE/BANDAGES/DRESSINGS) ×1 IMPLANT
SPONGE LAP 4X18 X RAY DECT (DISPOSABLE) ×2 IMPLANT
STAPLER VISISTAT 35W (STAPLE) ×2 IMPLANT
STOCKINETTE IMPERVIOUS 9X36 MD (GAUZE/BANDAGES/DRESSINGS) IMPLANT
SUT ETHILON 3 0 PS 1 (SUTURE) ×2 IMPLANT
SUT ETHILON 4 0 FS 1 (SUTURE) IMPLANT
SUT VIC AB 0 CT1 27 (SUTURE)
SUT VIC AB 0 CT1 27XBRD ANBCTR (SUTURE) IMPLANT
SUT VIC AB 2-0 CT1 27 (SUTURE)
SUT VIC AB 2-0 CT1 TAPERPNT 27 (SUTURE) IMPLANT
TOWEL OR 17X24 6PK STRL BLUE (TOWEL DISPOSABLE) ×2 IMPLANT
TOWEL OR 17X26 10 PK STRL BLUE (TOWEL DISPOSABLE) ×2 IMPLANT
WATER STERILE IRR 1000ML POUR (IV SOLUTION) ×2 IMPLANT

## 2014-01-15 NOTE — Brief Op Note (Signed)
01/15/2014  11:53 AM  PATIENT:  Savannah Peterson  58 y.o. female  PRE-OPERATIVE DIAGNOSIS:  Right ankle osteoarthritis, osteochondritis dissecans lesion talus and retained syndesmosis screws  POST-OPERATIVE DIAGNOSIS:  Right ankle osteoarthritis, osteochondritis dissecans lesion talus and retained syndesmosis screws  PROCEDURE:  Procedure(s): RIGHT ANKLE ARTHROSCOPY WITH DEBRIDEMENT, REMOVAL OF 2 SYNDESMOSIS SCREWS RIGHT ANKLE (Right) HARDWARE REMOVAL (Right)  SURGEON:  Surgeon(s) and Role:    * Kathryne Hitchhristopher Y Mikeal Winstanley, MD - Primary  PHYSICIAN ASSISTANT: Rexene EdisonGil Clark, PA-C  ANESTHESIA:   regional and general  EBL:  Total I/O In: 300 [I.V.:300] Out: -   BLOOD ADMINISTERED:none  DRAINS: none   LOCAL MEDICATIONS USED:  NONE  SPECIMEN:  No Specimen  DISPOSITION OF SPECIMEN:  N/A  COUNTS:  YES  TOURNIQUET:   Total Tourniquet Time Documented: Thigh (Right) - 43 minutes Total: Thigh (Right) - 43 minutes   DICTATION: .Other Dictation: Dictation Number [id number]  PLAN OF CARE: Discharge to home after PACU  PATIENT DISPOSITION:  PACU - hemodynamically stable.   Delay start of Pharmacological VTE agent (>24hrs) due to surgical blood loss or risk of bleeding: not applicable

## 2014-01-15 NOTE — Transfer of Care (Signed)
Immediate Anesthesia Transfer of Care Note  Patient: Savannah Peterson  Procedure(s) Performed: Procedure(s): RIGHT ANKLE ARTHROSCOPY WITH DEBRIDEMENT, REMOVAL OF 2 SYNDESMOSIS SCREWS RIGHT ANKLE (Right) HARDWARE REMOVAL (Right)  Patient Location: PACU  Anesthesia Type:General  Level of Consciousness: awake, alert  and oriented  Airway & Oxygen Therapy: Patient Spontanous Breathing  Post-op Assessment: Report given to PACU RN  Post vital signs: Reviewed and stable  Complications: No apparent anesthesia complications

## 2014-01-15 NOTE — Progress Notes (Signed)
Report given to elise rn as cargiver

## 2014-01-15 NOTE — Discharge Instructions (Signed)
Ice and elevation for your right ankle today. You can put full weight on your ankle as comfort allows when the block wears off. Leave your current dressing on your ankle for the next 4 days. In four days, you can remove your dressing and then start getting your incisions wet in the shower. New dry dressing or large band-aids daily starting in 4 days.  What to eat:  For your first meals, you should eat lightly; only small meals initially.  If you do not have nausea, you may eat larger meals.  Avoid spicy, greasy and heavy food.    General Anesthesia, Adult, Care After  Refer to this sheet in the next few weeks. These instructions provide you with information on caring for yourself after your procedure. Your health care provider may also give you more specific instructions. Your treatment has been planned according to current medical practices, but problems sometimes occur. Call your health care provider if you have any problems or questions after your procedure.  WHAT TO EXPECT AFTER THE PROCEDURE  After the procedure, it is typical to experience:  Sleepiness.  Nausea and vomiting. HOME CARE INSTRUCTIONS  For the first 24 hours after general anesthesia:  Have a responsible person with you.  Do not drive a car. If you are alone, do not take public transportation.  Do not drink alcohol.  Do not take medicine that has not been prescribed by your health care provider.  Do not sign important papers or make important decisions.  You may resume a normal diet and activities as directed by your health care provider.  Change bandages (dressings) as directed.  If you have questions or problems that seem related to general anesthesia, call the hospital and ask for the anesthetist or anesthesiologist on call. SEEK MEDICAL CARE IF:  You have nausea and vomiting that continue the day after anesthesia.  You develop a rash. SEEK IMMEDIATE MEDICAL CARE IF:  You have difficulty breathing.  You have chest  pain.  You have any allergic problems. Document Released: 12/25/2000 Document Revised: 05/21/2013 Document Reviewed: 04/03/2013  [facility] Patient Information 2014 [place], [place]LL[place].

## 2014-01-15 NOTE — Anesthesia Procedure Notes (Signed)
Anesthesia Regional Block:  Popliteal block  Pre-Anesthetic Checklist: ,, timeout performed, Correct Patient, Correct Site, Correct Laterality, Correct Procedure, Correct Position, site marked, Risks and benefits discussed,  Surgical consent,  Pre-op evaluation,  At surgeon's request and post-op pain management  Laterality: Right  Prep: chloraprep       Needles:  Injection technique: Single-shot  Needle Type: Echogenic Stimulator Needle     Needle Length: 9cm 9 cm Needle Gauge: 21 and 21 G    Additional Needles:  Procedures: ultrasound guided (picture in chart) and nerve stimulator Popliteal block  Nerve Stimulator or Paresthesia:  Response: 0.4 mA,   Additional Responses:   Narrative:  Start time: 01/15/2014 9:50 AM End time: 01/15/2014 10:05 AM Injection made incrementally with aspirations every 5 mL.  Performed by: Personally  Anesthesiologist: Arta BruceKevin Adama Ivins MD  Additional Notes: Monitors applied. Patient sedated. Sterile prep and drape,hand hygiene and sterile gloves were used. Relevant anatomy identified.Needle position confirmed.Local anesthetic injected incrementally after negative aspiration. Local anesthetic spread visualized around nerve(s). Vascular puncture avoided. No complications. Image printed for medical record.The patient tolerated the procedure well.  Additional Saphenous nerve block performed. 15cc Local Anesthetic mixture placed under ultrasonic guidance along the medio-inferior border of the Sartorious muscle 6 inches above the knee.  No Problems encountered.  Arta BruceKevin Cathline Dowen MD

## 2014-01-15 NOTE — Anesthesia Preprocedure Evaluation (Signed)
Anesthesia Evaluation  Patient identified by MRN, date of birth, ID band Patient awake    Reviewed: Allergy & Precautions, H&P , NPO status , Patient's Chart, lab work & pertinent test results  Airway Mallampati: I TM Distance: >3 FB Neck ROM: Full    Dental   Pulmonary          Cardiovascular hypertension, Pt. on medications     Neuro/Psych    GI/Hepatic   Endo/Other    Renal/GU      Musculoskeletal   Abdominal   Peds  Hematology   Anesthesia Other Findings   Reproductive/Obstetrics                           Anesthesia Physical Anesthesia Plan  ASA: II  Anesthesia Plan: General   Post-op Pain Management:    Induction: Intravenous  Airway Management Planned: LMA  Additional Equipment:   Intra-op Plan:   Post-operative Plan: Extubation in OR  Informed Consent: I have reviewed the patients History and Physical, chart, labs and discussed the procedure including the risks, benefits and alternatives for the proposed anesthesia with the patient or authorized representative who has indicated his/her understanding and acceptance.     Plan Discussed with: CRNA and Surgeon  Anesthesia Plan Comments:         Anesthesia Quick Evaluation  

## 2014-01-15 NOTE — H&P (Signed)
Savannah Peterson is an 58 y.o. female.   Chief Complaint:   Right ankle pain and stiffness; known previous trauma and ORIF HPI:   58 yo female who over a year ago sustained a right ankle fracture/dislocation and underwent ORIF including stabilization of her ankle syndesmosis.  She has developed post-traumatic cartilage changes at the right ankle joint as well as ankle stiffness and swelling.  It is recommended that she undergo a right ankle arthroscopy as well as removal of the two syndesmosis screws.  Past Medical History  Diagnosis Date  . Hypertension     Past Surgical History  Procedure Laterality Date  . Tubal ligation  1982  . Orif ankle fracture  10/11/2012    Procedure: OPEN REDUCTION INTERNAL FIXATION (ORIF) ANKLE FRACTURE;  Surgeon: Mcarthur Rossetti, MD;  Location: [facility];  Service: Orthopedics;  Laterality: Right;  Open Reduction Internal Fixation Right Ankle Fracture    Family History  Problem Relation Age of Onset  . Hypertension Mother   . Hypertension Father    Social History:  reports that she has never smoked. She has never used smokeless tobacco. She reports that she does not drink alcohol or use illicit drugs.  Allergies:  Allergies  Allergen Reactions  . Penicillins Rash    Medications Prior to Admission  Medication Sig Dispense Refill  . Fluticasone Propionate (FLONASE NA) Place 1 spray into the nose every morning.      Marland Kitchen ibuprofen (ADVIL,MOTRIN) 200 MG tablet Take 200 mg by mouth every 6 (six) hours as needed for moderate pain.       Marland Kitchen lisinopril-hydrochlorothiazide (PRINZIDE,ZESTORETIC) 20-25 MG per tablet Take 1 tablet by mouth every morning.       . meloxicam (MOBIC) 15 MG tablet Take 15 mg by mouth at bedtime as needed for pain. Taking at night only as needed      . Multiple Vitamin (MULTIVITAMIN WITH MINERALS) TABS Take 1 tablet by mouth daily.        Results for orders placed during the hospital encounter of 01/15/14 (from the past 48 hour(s))   CBC     Status: None   Collection Time    01/15/14  8:38 AM      Result Value Ref Range   WBC 7.2  4.0 - 10.5 K/uL   RBC 4.22  3.87 - 5.11 MIL/uL   Hemoglobin 13.5  12.0 - 15.0 g/dL   HCT 40.3  36.0 - 46.0 %   MCV 95.5  78.0 - 100.0 fL   MCH 32.0  26.0 - 34.0 pg   MCHC 33.5  30.0 - 36.0 g/dL   RDW 13.8  11.5 - 15.5 %   Platelets 324  150 - 400 K/uL  BASIC METABOLIC PANEL     Status: Abnormal   Collection Time    01/15/14  8:38 AM      Result Value Ref Range   Sodium 142  137 - 147 mEq/L   Potassium 4.4  3.7 - 5.3 mEq/L   Chloride 102  96 - 112 mEq/L   CO2 26  19 - 32 mEq/L   Glucose, Bld 104 (*) 70 - 99 mg/dL   BUN 14  6 - 23 mg/dL   Creatinine, Ser 0.86  0.50 - 1.10 mg/dL   Calcium 9.7  8.4 - 10.5 mg/dL   GFR calc non Af Amer 73 (*) >90 mL/min   GFR calc Af Amer 85 (*) >90 mL/min   Comment: (NOTE)  The eGFR has been calculated using the CKD EPI equation.     This calculation has not been validated in all clinical situations.     eGFR's persistently <90 mL/min signify possible Chronic Kidney     Disease.   Dg Chest 2 View  01/15/2014   CLINICAL DATA:  Preop hardware removal of the right ankle.  EXAM: CHEST  2 VIEW  COMPARISON:  DG C-ARM 1-60 MIN-NO REPORT dated 10/11/2012; DG CHEST 2 VIEW dated 10/09/2012  FINDINGS: The heart size and mediastinal contours are within normal limits. Both lungs are clear. The visualized skeletal structures are unremarkable.  IMPRESSION: No active cardiopulmonary disease.  Stable chest.   Electronically Signed   By: Rolla Flatten M.D.   On: 01/15/2014 08:38    Review of Systems  All other systems reviewed and are negative.   Blood pressure 170/78, pulse 66, temperature 98.3 F (36.8 C), temperature source Oral, resp. rate 14, height 5' 7"  (1.702 m), weight 99.791 kg (220 lb), last menstrual period 09/07/2009, SpO2 100.00%. Physical Exam  Constitutional: She is oriented to person, place, and time. She appears well-developed and well-nourished.   HENT:  Head: Normocephalic and atraumatic.  Eyes: EOM are normal. Pupils are equal, round, and reactive to light.  Neck: Normal range of motion. Neck supple.  Cardiovascular: Normal rate and regular rhythm.   Respiratory: Effort normal and breath sounds normal.  GI: Soft. Bowel sounds are normal.  Musculoskeletal:       Right ankle: She exhibits decreased range of motion and swelling. Tenderness. AITFL tenderness found.  Neurological: She is alert and oriented to person, place, and time.  Skin: Skin is warm and dry.  Psychiatric: She has a normal mood and affect.     Assessment/Plan Retained syndesmosis screws right ankle with post-traumatic scar tissue and arthritis right ankle joint 1)  To the OR today for removal of her syndesmosis screws from her right ankle as well as a right ankle arthroscopy for lysis of adhesions, debridement of scar tissue and cartilage deficits as an outpatient.  Mcarthur Rossetti 01/15/2014, 10:04 AM

## 2014-01-16 NOTE — Anesthesia Postprocedure Evaluation (Signed)
Anesthesia Post Note  Patient: Savannah Peterson  Procedure(s) Performed: Procedure(s) (LRB): RIGHT ANKLE ARTHROSCOPY WITH DEBRIDEMENT, REMOVAL OF 2 SYNDESMOSIS SCREWS RIGHT ANKLE (Right) HARDWARE REMOVAL (Right)  Anesthesia type: general  Patient location: PACU  Post pain: Pain level controlled  Post assessment: Patient's Cardiovascular Status Stable  Last Vitals:  Filed Vitals:   01/15/14 1245  BP: 146/82  Pulse: 64  Temp: 36.3 C  Resp:     Post vital signs: Reviewed and stable  Level of consciousness: sedated  Complications: No apparent anesthesia complications

## 2014-01-16 NOTE — Op Note (Signed)
NAMMargarite Peterson:  Savannah Peterson, Savannah Peterson             ACCOUNT NO.:  [id number]  MEDICAL RECORD NO.:  [id number]  LOCATION:  [facility]                         FACILITY:  [facility]  PHYSICIAN:  Vanita PandaChristopher Y. Magnus IvanBlackman, M.D.DATE OF BIRTH:  May 01, 1956  DATE OF PROCEDURE:  01/15/2014 DATE OF DISCHARGE:  01/15/2014                              OPERATIVE REPORT   PREOPERATIVE DIAGNOSES: 1. Retained syndesmosis screws right ankle. 2. 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  POSTOPERATIVE DIAGNOSES: 1. Retained syndesmosis screws right ankle. 2. 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  PROCEDURE: 1. Removal of 2 syndesmosis screws right ankle. 2. Right ankle arthroscopy with debridement and lysis of adhesions.  SURGEON:  Doneen Poissonhristopher Marquet Faircloth, MD  ASSISTANT:  Richardean CanalGilbert Clark, PA-C  ANESTHESIA: 1. Right leg popliteal block. 2. General.  BLOOD LOSS:  Minimal.  COMPLICATIONS:  None.  INDICATIONS:  Ms. Savannah Peterson is a 58 year old female who in January 2014 sustained a severe fracture dislocation involving her right ankle with fibula fracture and syndesmosis disruption.  She underwent open reduction and internal fixation with plating of the fibula and 2 syndesmosis screws.  She has since done well in her recovery but over the last few months has developed arthrofibrosis of her right ankle. She has had significant right ankle swelling and pain, and x-ray showed that she is developing some posttraumatic arthritis, but on exam the concern of scar tissue around the anterior talofibular ligament and limited mobility of her ankle.  She has failed conservative treatment including intra-articular injection of steroid in her ankle, anti- inflammatories, rest, ice, heat, time, and even therapy.  At this point, I recommended she undergo removal of the syndesmosis screws  with manipulation of her ankle and arthroscopy of the ankle for debridement of any cartilage irregularities as well as scar tissue in the ankle. The risks and benefits of this were explained to her in detail and she does wish to proceed with surgery.  PROCEDURE DESCRIPTION:  After informed consent was obtained and appropriate right ankle was marked, popliteal block was obtained in the holding area by Anesthesia of her right leg.  She was then brought to the operating room, placed supine on the operating table.  General anesthesia was then obtained.  Nonsterile tourniquet placed around her upper right thigh.  Her right leg was prepped and draped in the shin down to the toes with DuraPrep and sterile drapes.  A leg holder was used for the leg as well.  A time-out was called and she was identified as correct patient, correct right ankle.  We then had the tourniquet inflated to 300 mm of pressure.  An incision was made on the lateral aspect of her ankle over the syndesmosis screws and 2 syndesmosis screws were removed easily.  One the screw did break off in the bone but did not involve the syndesmosis.  I then irrigated the soft tissues with normal saline solution and closed the tissue over the plate with interrupted 2-0 nylon suture followed by interrupted 3-0 nylon on the skin.  We then proceeded with our arthroscopic portion of the case.  An anterolateral arthroscopy portal was made sharply with a knife in the ankle and the camera went directly to the medial side and made an anteromedial incision.  Right away, could see there were scar tissues all around it and cartilage irregularity around the medial malleolus . With this facet and the irregularity in the cartilage of the medial talus using arthroscopic shaver, I debrided these back to stable margins and found there was full-thickness cartilage loss except little bit on the medial facet.  You could see she was suffering from impingement  at the distal tibia anteriorly.  There was significant cartilage irregularity in scar tissue without debriding.  Following with the lateral gutter I debrided significant scar tissue around the anterior talofibular ligament.  The dome of the talus looked good laterally.  We then allowed fluid to lavage the ankle and removed all instrumentation. We closed the portal sites with interrupted 3-0 nylon sutures.  Xeroform and well-padded sterile dressing was applied around all incisions. Tourniquet was let down and the toes pinked nicely.  She was awakened, extubated, taken to recovery room in stable condition.  All final counts were correct and there were no complications noted.  Postoperatively, we will allow her to weight bear as tolerated.  Once the block wears off, we will slowly increase her activities.  We will see her back in the office in 2 weeks.     Vanita Pandahristopher Y. Magnus IvanBlackman, M.D.     CYB/MEDQ  D:  01/15/2014  T:  01/16/2014  Job:  [id number]

## 2014-01-19 ENCOUNTER — Encounter (HOSPITAL_COMMUNITY): Payer: Self-pay | Admitting: Orthopaedic Surgery

## 2014-04-09 ENCOUNTER — Ambulatory Visit: Payer: PRIVATE HEALTH INSURANCE | Attending: Orthopaedic Surgery

## 2014-04-09 DIAGNOSIS — IMO0001 Reserved for inherently not codable concepts without codable children: Secondary | ICD-10-CM | POA: Diagnosis not present

## 2014-04-09 DIAGNOSIS — M25673 Stiffness of unspecified ankle, not elsewhere classified: Secondary | ICD-10-CM | POA: Diagnosis not present

## 2014-04-09 DIAGNOSIS — M25676 Stiffness of unspecified foot, not elsewhere classified: Secondary | ICD-10-CM | POA: Insufficient documentation

## 2014-04-22 ENCOUNTER — Ambulatory Visit: Payer: PRIVATE HEALTH INSURANCE

## 2014-04-22 DIAGNOSIS — IMO0001 Reserved for inherently not codable concepts without codable children: Secondary | ICD-10-CM | POA: Diagnosis not present

## 2014-04-27 ENCOUNTER — Ambulatory Visit: Payer: PRIVATE HEALTH INSURANCE | Admitting: Physical Therapy

## 2014-04-27 DIAGNOSIS — IMO0001 Reserved for inherently not codable concepts without codable children: Secondary | ICD-10-CM | POA: Diagnosis not present

## 2014-04-29 ENCOUNTER — Ambulatory Visit: Payer: PRIVATE HEALTH INSURANCE | Admitting: Physical Therapy

## 2014-04-29 DIAGNOSIS — IMO0001 Reserved for inherently not codable concepts without codable children: Secondary | ICD-10-CM | POA: Diagnosis not present

## 2014-05-05 ENCOUNTER — Ambulatory Visit: Payer: PRIVATE HEALTH INSURANCE | Attending: Orthopaedic Surgery | Admitting: Physical Therapy

## 2014-05-05 DIAGNOSIS — M25673 Stiffness of unspecified ankle, not elsewhere classified: Secondary | ICD-10-CM | POA: Diagnosis not present

## 2014-05-05 DIAGNOSIS — M25676 Stiffness of unspecified foot, not elsewhere classified: Secondary | ICD-10-CM | POA: Insufficient documentation

## 2014-05-05 DIAGNOSIS — IMO0001 Reserved for inherently not codable concepts without codable children: Secondary | ICD-10-CM | POA: Diagnosis not present

## 2014-05-07 ENCOUNTER — Ambulatory Visit: Payer: PRIVATE HEALTH INSURANCE | Admitting: Rehabilitation

## 2014-05-07 DIAGNOSIS — IMO0001 Reserved for inherently not codable concepts without codable children: Secondary | ICD-10-CM | POA: Diagnosis not present

## 2014-05-18 ENCOUNTER — Ambulatory Visit: Payer: PRIVATE HEALTH INSURANCE | Admitting: Rehabilitation

## 2014-05-18 DIAGNOSIS — IMO0001 Reserved for inherently not codable concepts without codable children: Secondary | ICD-10-CM | POA: Diagnosis not present

## 2014-05-20 ENCOUNTER — Ambulatory Visit: Payer: PRIVATE HEALTH INSURANCE | Admitting: Rehabilitation

## 2014-05-20 DIAGNOSIS — IMO0001 Reserved for inherently not codable concepts without codable children: Secondary | ICD-10-CM | POA: Diagnosis not present

## 2014-05-26 ENCOUNTER — Ambulatory Visit: Payer: PRIVATE HEALTH INSURANCE | Admitting: Physical Therapy

## 2014-05-26 DIAGNOSIS — IMO0001 Reserved for inherently not codable concepts without codable children: Secondary | ICD-10-CM | POA: Diagnosis not present

## 2014-05-28 ENCOUNTER — Encounter: Payer: PRIVATE HEALTH INSURANCE | Admitting: Physical Therapy

## 2014-06-01 ENCOUNTER — Ambulatory Visit: Payer: PRIVATE HEALTH INSURANCE | Admitting: Rehabilitation

## 2014-06-01 DIAGNOSIS — IMO0001 Reserved for inherently not codable concepts without codable children: Secondary | ICD-10-CM | POA: Diagnosis not present

## 2014-06-04 ENCOUNTER — Ambulatory Visit: Payer: PRIVATE HEALTH INSURANCE | Attending: Orthopaedic Surgery

## 2014-06-04 DIAGNOSIS — IMO0001 Reserved for inherently not codable concepts without codable children: Secondary | ICD-10-CM | POA: Insufficient documentation

## 2014-06-04 DIAGNOSIS — M25673 Stiffness of unspecified ankle, not elsewhere classified: Secondary | ICD-10-CM | POA: Diagnosis not present

## 2014-06-04 DIAGNOSIS — M25676 Stiffness of unspecified foot, not elsewhere classified: Secondary | ICD-10-CM | POA: Insufficient documentation

## 2014-06-08 ENCOUNTER — Ambulatory Visit (INDEPENDENT_AMBULATORY_CARE_PROVIDER_SITE_OTHER): Payer: 59 | Admitting: Emergency Medicine

## 2014-06-08 VITALS — BP 134/90 | HR 80 | Temp 98.2°F | Resp 18 | Ht 67.0 in | Wt 233.0 lb

## 2014-06-08 DIAGNOSIS — N3000 Acute cystitis without hematuria: Secondary | ICD-10-CM

## 2014-06-08 DIAGNOSIS — Z202 Contact with and (suspected) exposure to infections with a predominantly sexual mode of transmission: Secondary | ICD-10-CM

## 2014-06-08 DIAGNOSIS — R319 Hematuria, unspecified: Secondary | ICD-10-CM

## 2014-06-08 DIAGNOSIS — N3001 Acute cystitis with hematuria: Secondary | ICD-10-CM

## 2014-06-08 DIAGNOSIS — I10 Essential (primary) hypertension: Secondary | ICD-10-CM

## 2014-06-08 LAB — POCT UA - MICROSCOPIC ONLY
CRYSTALS, UR, HPF, POC: NEGATIVE
Casts, Ur, LPF, POC: NEGATIVE
Mucus, UA: NEGATIVE
Yeast, UA: NEGATIVE

## 2014-06-08 LAB — POCT URINALYSIS DIPSTICK
Bilirubin, UA: NEGATIVE
GLUCOSE UA: NEGATIVE
Ketones, UA: NEGATIVE
Leukocytes, UA: NEGATIVE
Nitrite, UA: NEGATIVE
PROTEIN UA: NEGATIVE
Spec Grav, UA: 1.02
UROBILINOGEN UA: 0.2
pH, UA: 7.5

## 2014-06-08 MED ORDER — LISINOPRIL 20 MG PO TABS: 20.0000 mg | ORAL_TABLET | Freq: Every day | ORAL | Status: DC

## 2014-06-08 MED ORDER — CIPROFLOXACIN HCL 500 MG PO TABS: 500.0000 mg | ORAL_TABLET | Freq: Two times a day (BID) | ORAL | Status: DC

## 2014-06-08 MED ORDER — PHENAZOPYRIDINE HCL 200 MG PO TABS: 200.0000 mg | ORAL_TABLET | Freq: Three times a day (TID) | ORAL | Status: DC | PRN

## 2014-06-08 NOTE — Patient Instructions (Signed)

## 2014-06-08 NOTE — Progress Notes (Signed)
[facility] [address] [place] [address] [phone]  Date:  06/08/2014   Name:  Savannah Peterson   DOB:  Sep 14, 1956   MRN:  [id number]  PCP:  No primary provider on file.    Chief Complaint: Back Pain, Hematuria, rx refills and std check   History of Present Illness:  Savannah Peterson is a 58 y.o. very pleasant female patient who presents with the following:  Has a number of concerns.  Ran out of blood pressure medication over a year ago and requests a refill.  Has no complaints referable to end organ injury. Just ended a relationship with a her partner and he informed her he had been "sleeping around" and she requests an STD check.  She is asymptomatic and has no pelvic pain, discharge, or dyspareunia. Has low back pain, dysuria and frequency No fever or chills.  No GI symptoms.   No improvement with over the counter medications or other home remedies. Denies other complaint or health concern today.   Patient Active Problem List   Diagnosis Date Noted  . Arthritis of ankle, right 01/15/2014  . Fracture of right ankle, lateral malleolus 10/11/2012    Past Medical History  Diagnosis Date  . Hypertension     Past Surgical History  Procedure Laterality Date  . Tubal ligation  1982  . Orif ankle fracture  10/11/2012    Procedure: OPEN REDUCTION INTERNAL FIXATION (ORIF) ANKLE FRACTURE;  Surgeon: Kathryne Hitch, MD;  Location: [facility] ORS;  Service: Orthopedics;  Laterality: Right;  Open Reduction Internal Fixation Right Ankle Fracture  . Ankle arthroscopy Right 01/15/2014    Procedure: RIGHT ANKLE ARTHROSCOPY WITH DEBRIDEMENT, REMOVAL OF 2 SYNDESMOSIS SCREWS RIGHT ANKLE;  Surgeon: Kathryne Hitch, MD;  Location: [facility] OR;  Service: Orthopedics;  Laterality: Right;  . Hardware removal Right 01/15/2014    Procedure: HARDWARE REMOVAL;  Surgeon: Kathryne Hitch, MD;  Location: [facility] OR;  Service: Orthopedics;  Laterality: Right;    History   Substance Use Topics  . Smoking status: Never Smoker   . Smokeless tobacco: Never Used  . Alcohol Use: No    Family History  Problem Relation Age of Onset  . Hypertension Mother   . Hypertension Father     Allergies  Allergen Reactions  . Asa [Aspirin]   . Penicillins Rash    Medication list has been reviewed and updated.  Current Outpatient Prescriptions on File Prior to Visit  Medication Sig Dispense Refill  . Multiple Vitamin (MULTIVITAMIN WITH MINERALS) TABS Take 1 tablet by mouth daily.      . Fluticasone Propionate (FLONASE NA) Place 1 spray into the nose every morning.      Marland Kitchen lisinopril-hydrochlorothiazide (PRINZIDE,ZESTORETIC) 20-25 MG per tablet Take 1 tablet by mouth every morning.       . meloxicam (MOBIC) 15 MG tablet Take 15 mg by mouth at bedtime as needed for pain. Taking at night only as needed      . oxyCODONE-acetaminophen (ROXICET) 5-325 MG per tablet Take 1-2 tablets by mouth every 4 (four) hours as needed for severe pain.  60 tablet  0  . promethazine (PHENERGAN) 12.5 MG tablet Take 1 tablet (12.5 mg total) by mouth every 6 (six) hours as needed for nausea or vomiting.  10 tablet  0   No current facility-administered medications on file prior to visit.    Review of Systems:  As per HPI, otherwise negative.    Physical Examination: Filed Vitals:  06/08/14 1413  BP: 134/90  Pulse: 80  Temp: 98.2 F (36.8 C)  Resp: 18   Filed Vitals:   06/08/14 1413  Height:  (1.702 m)  Weight: 233 lb (105.688 kg)   Body mass index is 36.48 kg/(m^2). Ideal Body Weight: Weight in (lb) to have BMI = 25: 159.3  GEN: WDWN, NAD, Non-toxic, A & O x 3 HEENT: Atraumatic, Normocephalic. Neck supple. No masses, No LAD. Ears and Nose: No external deformity. CV: RRR, No M/G/R. No JVD. No thrill. No extra heart sounds. PULM: CTA B, no wheezes, crackles, rhonchi. No retractions. No resp. distress. No accessory muscle use. ABD: S, NT, ND, +BS. No rebound. No  HSM. EXTR: No c/c/e NEURO Normal gait.  PSYCH: Normally interactive. Conversant. Not depressed or anxious appearing.  Calm demeanor.    Assessment and Plan: Cystitis cipro Pyridium  Signed,  Phillips Odor, MD   Results for orders placed in visit on 06/08/14  POCT URINALYSIS DIPSTICK      Result Value Ref Range   Color, UA yellow     Clarity, UA cloudy     Glucose, UA neg     Bilirubin, UA neg     Ketones, UA neg     Spec Grav, UA 1.020     Blood, UA moderate     pH, UA 7.5     Protein, UA neg     Urobilinogen, UA 0.2     Nitrite, UA neg     Leukocytes, UA Negative    POCT UA - MICROSCOPIC ONLY      Result Value Ref Range   WBC, Ur, HPF, POC 0-1     RBC, urine, microscopic 0-2     Bacteria, U Microscopic trace     Mucus, UA neg     Epithelial cells, urine per micros 2-4     Crystals, Ur, HPF, POC neg     Casts, Ur, LPF, POC neg     Yeast, UA neg

## 2014-06-09 ENCOUNTER — Encounter: Payer: PRIVATE HEALTH INSURANCE | Admitting: Rehabilitation

## 2014-06-11 ENCOUNTER — Encounter: Payer: PRIVATE HEALTH INSURANCE | Admitting: Rehabilitation

## 2014-06-18 ENCOUNTER — Telehealth: Payer: Self-pay

## 2014-06-18 NOTE — Telephone Encounter (Signed)
Spoke to pt. Advised her to RTC for a re-check to make sure her symptoms are not from an illness.

## 2014-06-18 NOTE — Telephone Encounter (Signed)
Pt called in and says she was here on 9-7 and was put on Lisinopril for her bp and she has been coughing and having headaches. She knows this is effects of the drug and wants to be switched to another one. She can be reached @ [phone] Thank you

## 2014-06-19 ENCOUNTER — Encounter: Payer: 59 | Admitting: Internal Medicine

## 2014-06-20 ENCOUNTER — Ambulatory Visit (INDEPENDENT_AMBULATORY_CARE_PROVIDER_SITE_OTHER): Payer: 59 | Admitting: Internal Medicine

## 2014-06-20 VITALS — BP 149/92 | HR 75 | Temp 97.9°F | Resp 18 | Ht 66.5 in | Wt 231.0 lb

## 2014-06-20 DIAGNOSIS — Z79899 Other long term (current) drug therapy: Secondary | ICD-10-CM

## 2014-06-20 DIAGNOSIS — I10 Essential (primary) hypertension: Secondary | ICD-10-CM

## 2014-06-20 DIAGNOSIS — R059 Cough, unspecified: Secondary | ICD-10-CM

## 2014-06-20 DIAGNOSIS — T50905A Adverse effect of unspecified drugs, medicaments and biological substances, initial encounter: Secondary | ICD-10-CM

## 2014-06-20 DIAGNOSIS — R05 Cough: Secondary | ICD-10-CM

## 2014-06-20 MED ORDER — AMLODIPINE BESYLATE 5 MG PO TABS: 5.0000 mg | ORAL_TABLET | Freq: Every day | ORAL | Status: DC

## 2014-06-20 NOTE — Progress Notes (Signed)
   Subjective:    Patient ID: Savannah Peterson, female    DOB: 12-12-1955, 58 y.o.   MRN: [id number]  HPI HTN was controlled but cough side affect developed. Hx of allergys. Cough stopped when lisinopril was stopped. Needs new med and primary care. Feels good otherwise.   Review of Systems     Objective:   Physical Exam  Constitutional: She is oriented to person, place, and time. She appears well-developed and well-nourished.  HENT:  Head: Normocephalic.  Nose: Nose normal.  Eyes: EOM are normal. Pupils are equal, round, and reactive to light.  Neck: Normal range of motion. Neck supple.  Cardiovascular: Normal rate, regular rhythm and normal heart sounds.   Pulmonary/Chest: Effort normal and breath sounds normal.  Musculoskeletal: Normal range of motion.  Neurological: She is alert and oriented to person, place, and time. She exhibits normal muscle tone. Coordination normal.  Skin: No rash noted.  Psychiatric: She has a normal mood and affect.          Assessment & Plan:  HTN Cough/Drug side affect lisinopril DC lisinopril Start amlodipine  qd Follow up 104 one month

## 2014-06-20 NOTE — Patient Instructions (Signed)
Hypertension Hypertension, commonly called high blood pressure, is when the force of blood pumping through your arteries is too strong. Your arteries are the blood vessels that carry blood from your heart throughout your body. A blood pressure reading consists of a higher number over a lower number, such as 110/72. The higher number (systolic) is the pressure inside your arteries when your heart pumps. The lower number (diastolic) is the pressure inside your arteries when your heart relaxes. Ideally you want your blood pressure below 120/80. Hypertension forces your heart to work harder to pump blood. Your arteries may become narrow or stiff. Having hypertension puts you at risk for heart disease, stroke, and other problems.  RISK FACTORS Some risk factors for high blood pressure are controllable. Others are not.  Risk factors you cannot control include:   Race. You may be at higher risk if you are African American.  Age. Risk increases with age.  Gender. Men are at higher risk than women before age 45 years. After age 65, women are at higher risk than men. Risk factors you can control include:  Not getting enough exercise or physical activity.  Being overweight.  Getting too much fat, sugar, calories, or salt in your diet.  Drinking too much alcohol. SIGNS AND SYMPTOMS Hypertension does not usually cause signs or symptoms. Extremely high blood pressure (hypertensive crisis) may cause headache, anxiety, shortness of breath, and nosebleed. DIAGNOSIS  To check if you have hypertension, your health care provider will measure your blood pressure while you are seated, with your arm held at the level of your heart. It should be measured at least twice using the same arm. Certain conditions can cause a difference in blood pressure between your right and left arms. A blood pressure reading that is higher than normal on one occasion does not mean that you need treatment. If one blood pressure reading  is high, ask your health care provider about having it checked again. TREATMENT  Treating high blood pressure includes making lifestyle changes and possibly taking medicine. Living a healthy lifestyle can help lower high blood pressure. You may need to change some of your habits. Lifestyle changes may include:  Following the DASH diet. This diet is high in fruits, vegetables, and whole grains. It is low in salt, red meat, and added sugars.  Getting at least 2 hours of brisk physical activity every week.  Losing weight if necessary.  Not smoking.  Limiting alcoholic beverages.  Learning ways to reduce stress. If lifestyle changes are not enough to get your blood pressure under control, your health care provider may prescribe medicine. You may need to take more than one. Work closely with your health care provider to understand the risks and benefits. HOME CARE INSTRUCTIONS  Have your blood pressure rechecked as directed by your health care provider.   Take medicines only as directed by your health care provider. Follow the directions carefully. Blood pressure medicines must be taken as prescribed. The medicine does not work as well when you skip doses. Skipping doses also puts you at risk for problems.   Do not smoke.   Monitor your blood pressure at home as directed by your health care provider. SEEK MEDICAL CARE IF:   You think you are having a reaction to medicines taken.  You have recurrent headaches or feel dizzy.  You have swelling in your ankles.  You have trouble with your vision. SEEK IMMEDIATE MEDICAL CARE IF:  You develop a severe headache or confusion.    You have unusual weakness, numbness, or feel faint.  You have severe chest or abdominal pain.  You vomit repeatedly.  You have trouble breathing. MAKE SURE YOU:   Understand these instructions.  Will watch your condition.  Will get help right away if you are not doing well or get worse. Document  Released: 09/18/2005 Document Revised: 02/02/2014 Document Reviewed: 07/11/2013 ExitCare Patient Information 2015 [employer]. This information is not intended to replace advice given to you by your health care provider. Make sure you discuss any questions you have with your health care provider. DASH Eating Plan DASH stands for "Dietary Approaches to Stop Hypertension." The DASH eating plan is a healthy eating plan that has been shown to reduce high blood pressure (hypertension). Additional health benefits may include reducing the risk of type 2 diabetes mellitus, heart disease, and stroke. The DASH eating plan may also help with weight loss. WHAT DO I NEED TO KNOW ABOUT THE DASH EATING PLAN? For the DASH eating plan, you will follow these general guidelines:  Choose foods with a percent daily value for sodium of less than 5% (as listed on the food label).  Use salt-free seasonings or herbs instead of table salt or sea salt.  Check with your health care provider or pharmacist before using salt substitutes.  Eat lower-sodium products, often labeled as "lower sodium" or "no salt added."  Eat fresh foods.  Eat more vegetables, fruits, and low-fat dairy products.  Choose whole grains. Look for the word "whole" as the first word in the ingredient list.  Choose fish and skinless chicken or turkey more often than red meat. Limit fish, poultry, and meat to 6 oz (170 g) each day.  Limit sweets, desserts, sugars, and sugary drinks.  Choose heart-healthy fats.  Limit cheese to 1 oz (28 g) per day.  Eat more home-cooked food and less restaurant, buffet, and fast food.  Limit fried foods.  Cook foods using methods other than frying.  Limit canned vegetables. If you do use them, rinse them well to decrease the sodium.  When eating at a restaurant, ask that your food be prepared with less salt, or no salt if possible. WHAT FOODS CAN I EAT? Seek help from a dietitian for individual  calorie needs. Grains Whole grain or whole wheat bread. Brown rice. Whole grain or whole wheat pasta. Quinoa, bulgur, and whole grain cereals. Low-sodium cereals. Corn or whole wheat flour tortillas. Whole grain cornbread. Whole grain crackers. Low-sodium crackers. Vegetables Fresh or frozen vegetables (raw, steamed, roasted, or grilled). Low-sodium or reduced-sodium tomato and vegetable juices. Low-sodium or reduced-sodium tomato sauce and paste. Low-sodium or reduced-sodium canned vegetables.  Fruits All fresh, canned (in natural juice), or frozen fruits. Meat and Other Protein Products Ground beef (85% or leaner), grass-fed beef, or beef trimmed of fat. Skinless chicken or turkey. Ground chicken or turkey. Pork trimmed of fat. All fish and seafood. Eggs. Dried beans, peas, or lentils. Unsalted nuts and seeds. Unsalted canned beans. Dairy Low-fat dairy products, such as skim or 1% milk, 2% or reduced-fat cheeses, low-fat ricotta or cottage cheese, or plain low-fat yogurt. Low-sodium or reduced-sodium cheeses. Fats and Oils Tub margarines without trans fats. Light or reduced-fat mayonnaise and salad dressings (reduced sodium). Avocado. Safflower, olive, or canola oils. Natural peanut or almond butter. Other Unsalted popcorn and pretzels. The items listed above may not be a complete list of recommended foods or beverages. Contact your dietitian for more options. WHAT FOODS ARE NOT RECOMMENDED? Grains White bread.   White pasta. White rice. Refined cornbread. Bagels and croissants. Crackers that contain trans fat. Vegetables Creamed or fried vegetables. Vegetables in a cheese sauce. Regular canned vegetables. Regular canned tomato sauce and paste. Regular tomato and vegetable juices. Fruits Dried fruits. Canned fruit in light or heavy syrup. Fruit juice. Meat and Other Protein Products Fatty cuts of meat. Ribs, chicken wings, bacon, sausage, bologna, salami, chitterlings, fatback, hot dogs,  bratwurst, and packaged luncheon meats. Salted nuts and seeds. Canned beans with salt. Dairy Whole or 2% milk, cream, half-and-half, and cream cheese. Whole-fat or sweetened yogurt. Full-fat cheeses or blue cheese. Nondairy creamers and whipped toppings. Processed cheese, cheese spreads, or cheese curds. Condiments Onion and garlic salt, seasoned salt, table salt, and sea salt. Canned and packaged gravies. Worcestershire sauce. Tartar sauce. Barbecue sauce. Teriyaki sauce. Soy sauce, including reduced sodium. Steak sauce. Fish sauce. Oyster sauce. Cocktail sauce. Horseradish. Ketchup and mustard. Meat flavorings and tenderizers. Bouillon cubes. Hot sauce. Tabasco sauce. Marinades. Taco seasonings. Relishes. Fats and Oils Butter, stick margarine, lard, shortening, ghee, and bacon fat. Coconut, palm kernel, or palm oils. Regular salad dressings. Other Pickles and olives. Salted popcorn and pretzels. The items listed above may not be a complete list of foods and beverages to avoid. Contact your dietitian for more information. WHERE CAN I FIND MORE INFORMATION? National Heart, Lung, and Blood Institute: www.nhlbi.nih.gov/health/health-topics/topics/dash/ Document Released: 09/07/2011 Document Revised: 02/02/2014 Document Reviewed: 07/23/2013 ExitCare Patient Information 2015 [employer]. This information is not intended to replace advice given to you by your health care provider. Make sure you discuss any questions you have with your health care provider.  

## 2014-06-20 NOTE — Progress Notes (Signed)
This encounter was created in error - please disregard.

## 2014-09-04 ENCOUNTER — Ambulatory Visit (INDEPENDENT_AMBULATORY_CARE_PROVIDER_SITE_OTHER): Payer: 59 | Admitting: Family Medicine

## 2014-09-04 ENCOUNTER — Encounter: Payer: Self-pay | Admitting: Family Medicine

## 2014-09-04 VITALS — BP 150/90 | HR 97 | Temp 98.5°F | Resp 16 | Ht 67.0 in | Wt 226.0 lb

## 2014-09-04 DIAGNOSIS — I10 Essential (primary) hypertension: Secondary | ICD-10-CM

## 2014-09-04 LAB — COMPLETE METABOLIC PANEL WITHOUT GFR
ALT: 20 U/L (ref 0–35)
Alkaline Phosphatase: 66 U/L (ref 39–117)
Potassium: 3.9 meq/L (ref 3.5–5.3)
Sodium: 139 meq/L (ref 135–145)
Total Bilirubin: 0.4 mg/dL (ref 0.2–1.2)
Total Protein: 7.2 g/dL (ref 6.0–8.3)

## 2014-09-04 LAB — COMPLETE METABOLIC PANEL WITH GFR
AST: 22 U/L (ref 0–37)
Albumin: 4 g/dL (ref 3.5–5.2)
BUN: 14 mg/dL (ref 6–23)
CO2: 26 mEq/L (ref 19–32)
Calcium: 9.7 mg/dL (ref 8.4–10.5)
Chloride: 103 mEq/L (ref 96–112)
Creat: 0.81 mg/dL (ref 0.50–1.10)
GFR, Est African American: >89 mL/min
GFR, Est Non African American: 80 mL/min
Glucose, Bld: 86 mg/dL (ref 70–99)

## 2014-09-04 MED ORDER — AMLODIPINE BESYLATE 10 MG PO TABS: 10.0000 mg | ORAL_TABLET | Freq: Every day | ORAL | Status: DC

## 2014-09-04 NOTE — Progress Notes (Signed)
 Chief Complaint:  Chief Complaint  Patient presents with  . Establish Care    follow HYPERTENSION    HPI: Savannah Peterson is a 58 y.o. female who is here for  HTN HAs been on norvasc 5 mg HEr BP was 150-170-s/90-100s.  CP, SOB< lsight vertical HA.No N/v She had an allergy to ACEI with cough so that was stopped   BP Readings from Last 3 Encounters:  09/04/14 150/90  06/20/14 149/92  06/19/14 160/92    Wt Readings from Last 3 Encounters:  09/04/14 226 lb (102.513 kg)  06/20/14 231 lb (104.781 kg)  06/19/14 231 lb (104.781 kg)     Past Medical History  Diagnosis Date  . Hypertension    Past Surgical History  Procedure Laterality Date  . Tubal ligation  1982  . Orif ankle fracture  10/11/2012    Procedure: OPEN REDUCTION INTERNAL FIXATION (ORIF) ANKLE FRACTURE;  Surgeon: Kathryne Hitchhristopher Y Blackman, MD;  Location: [facility];  Service: Orthopedics;  Laterality: Right;  Open Reduction Internal Fixation Right Ankle Fracture  . Ankle arthroscopy Right 01/15/2014    Procedure: RIGHT ANKLE ARTHROSCOPY WITH DEBRIDEMENT, REMOVAL OF 2 SYNDESMOSIS SCREWS RIGHT ANKLE;  Surgeon: Kathryne Hitchhristopher Y Blackman, MD;  Location: [facility];  Service: Orthopedics;  Laterality: Right;  . Hardware removal Right 01/15/2014    Procedure: HARDWARE REMOVAL;  Surgeon: Kathryne Hitchhristopher Y Blackman, MD;  [facility];  Service: Orthopedics;  Laterality: Right;   History   Social History  . Marital Status: Divorced    Spouse Name: N/A    Number of Children: N/A  . Years of Education: N/A   Social History Main Topics  . Smoking status: Never Smoker   . Smokeless tobacco: Never Used  . Alcohol Use: No  . Drug Use: No  . Sexual Activity: Yes    Birth Control/ Protection: Surgical   Other Topics Concern  . None   Social History Narrative   Family History  Problem Relation Age of Onset  . Hypertension Mother   . Hypertension Father    Allergies  Allergen Reactions  . Ace Inhibitors   . Asa  [Aspirin]   . Penicillins Rash   Prior to Admission medications   Medication Sig Start Date End Date Taking? Authorizing Provider  amLODipine (NORVASC) 5 MG tablet Take 1 tablet (5 mg total) by mouth daily. 06/20/14  Yes Jonita Albeehris W Guest, MD  gabapentin (NEURONTIN) 100 MG capsule Take 100 mg by mouth 3 (three) times daily.   Yes Historical Provider, MD  Multiple Vitamin (MULTIVITAMIN WITH MINERALS) TABS Take 1 tablet by mouth daily.   Yes Historical Provider, MD  Fluticasone Propionate (FLONASE NA) Place 1 spray into the nose every morning.    Historical Provider, MD     ROS: The patient denies fevers, chills, night sweats, unintentional weight loss, chest pain, palpitations, wheezing, dyspnea on exertion, nausea, vomiting, abdominal pain, dysuria, hematuria, melena, numbness, weakness, or tingling.  All other systems have been reviewed and were otherwise negative with the exception of those mentioned in the HPI and as above.    PHYSICAL EXAM: Filed Vitals:   09/04/14 1525  BP: 150/90  Pulse: 97  Temp: 98.5 F (36.9 C)  Resp: 16   Filed Vitals:   09/04/14 1525  Height: 5\' 7"  (1.702 m)  Weight: 226 lb (102.513 kg)   Body mass index is 35.39 kg/(m^2).  General: Alert, no acute distress HEENT:  Normocephalic, atraumatic, oropharynx patent. EOMI, PERRLA Cardiovascular:  Regular  rate and rhythm, no rubs murmurs or gallops.  No Carotid bruits, radial pulse intact. No pedal edema.  Respiratory: Clear to auscultation bilaterally.  No wheezes, rales, or rhonchi.  No cyanosis, no use of accessory musculature GI: No organomegaly, abdomen is soft and non-tender, positive bowel sounds.  No masses. Skin: No rashes. Neurologic: Facial musculature symmetric. Psychiatric: Patient is appropriate throughout our interaction. Lymphatic: No cervical lymphadenopathy Musculoskeletal: Gait intact.   LABS: Results for orders placed or performed in visit on 06/08/14  POCT urinalysis dipstick  Result  Value Ref Range   Color, UA yellow    Clarity, UA cloudy    Glucose, UA neg    Bilirubin, UA neg    Ketones, UA neg    Spec Grav, UA 1.020    Blood, UA moderate    pH, UA 7.5    Protein, UA neg    Urobilinogen, UA 0.2    Nitrite, UA neg    Leukocytes, UA Negative   POCT UA - Microscopic Only  Result Value Ref Range   WBC, Ur, HPF, POC 0-1    RBC, urine, microscopic 0-2    Bacteria, U Microscopic trace    Mucus, UA neg    Epithelial cells, urine per micros 2-4    Crystals, Ur, HPF, POC neg    Casts, Ur, LPF, POC neg    Yeast, UA neg      EKG/XRAY:   [facility]   ASSESSMENT/PLAN: Encounter Diagnosis  Name Primary?  . Essential hypertension Yes   Refilled meds Will call with BP logs in the next 48-72 hours.  Increase norvasc   Gross sideeffects, risk and benefits, and alternatives of medications d/w patient. Patient is aware that all medications have potential sideeffects and we are unable to predict every sideeffect or drug-drug interaction that may occur.  Hamilton CapriLE,  PHUONG, DO 09/04/2014 3:54 PM

## 2014-09-04 NOTE — Patient Instructions (Signed)
DASH Eating Plan DASH stands for "Dietary Approaches to Stop Hypertension." The DASH eating plan is a healthy eating plan that has been shown to reduce high blood pressure (hypertension). Additional health benefits may include reducing the risk of type 2 diabetes mellitus, heart disease, and stroke. The DASH eating plan may also help with weight loss. WHAT DO I NEED TO KNOW ABOUT THE DASH EATING PLAN? For the DASH eating plan, you will follow these general guidelines:  Choose foods with a percent daily value for sodium of less than 5% (as listed on the food label).  Use salt-free seasonings or herbs instead of table salt or sea salt.  Check with your health care provider or pharmacist before using salt substitutes.  Eat lower-sodium products, often labeled as "lower sodium" or "no salt added."  Eat fresh foods.  Eat more vegetables, fruits, and low-fat dairy products.  Choose whole grains. Look for the word "whole" as the first word in the ingredient list.  Choose fish and skinless chicken or turkey more often than red meat. Limit fish, poultry, and meat to 6 oz (170 g) each day.  Limit sweets, desserts, sugars, and sugary drinks.  Choose heart-healthy fats.  Limit cheese to 1 oz (28 g) per day.  Eat more home-cooked food and less restaurant, buffet, and fast food.  Limit fried foods.  Cook foods using methods other than frying.  Limit canned vegetables. If you do use them, rinse them well to decrease the sodium.  When eating at a restaurant, ask that your food be prepared with less salt, or no salt if possible. WHAT FOODS CAN I EAT? Seek help from a dietitian for individual calorie needs. Grains Whole grain or whole wheat bread. Brown rice. Whole grain or whole wheat pasta. Quinoa, bulgur, and whole grain cereals. Low-sodium cereals. Corn or whole wheat flour tortillas. Whole grain cornbread. Whole grain crackers. Low-sodium crackers. Vegetables Fresh or frozen vegetables  (raw, steamed, roasted, or grilled). Low-sodium or reduced-sodium tomato and vegetable juices. Low-sodium or reduced-sodium tomato sauce and paste. Low-sodium or reduced-sodium canned vegetables.  Fruits All fresh, canned (in natural juice), or frozen fruits. Meat and Other Protein Products Ground beef (85% or leaner), grass-fed beef, or beef trimmed of fat. Skinless chicken or turkey. Ground chicken or turkey. Pork trimmed of fat. All fish and seafood. Eggs. Dried beans, peas, or lentils. Unsalted nuts and seeds. Unsalted canned beans. Dairy Low-fat dairy products, such as skim or 1% milk, 2% or reduced-fat cheeses, low-fat ricotta or cottage cheese, or plain low-fat yogurt. Low-sodium or reduced-sodium cheeses. Fats and Oils Tub margarines without trans fats. Light or reduced-fat mayonnaise and salad dressings (reduced sodium). Avocado. Safflower, olive, or canola oils. Natural peanut or almond butter. Other Unsalted popcorn and pretzels. The items listed above may not be a complete list of recommended foods or beverages. Contact your dietitian for more options. WHAT FOODS ARE NOT RECOMMENDED? Grains White bread. White pasta. White rice. Refined cornbread. Bagels and croissants. Crackers that contain trans fat. Vegetables Creamed or fried vegetables. Vegetables in a cheese sauce. Regular canned vegetables. Regular canned tomato sauce and paste. Regular tomato and vegetable juices. Fruits Dried fruits. Canned fruit in light or heavy syrup. Fruit juice. Meat and Other Protein Products Fatty cuts of meat. Ribs, chicken wings, bacon, sausage, bologna, salami, chitterlings, fatback, hot dogs, bratwurst, and packaged luncheon meats. Salted nuts and seeds. Canned beans with salt. Dairy Whole or 2% milk, cream, half-and-half, and cream cheese. Whole-fat or sweetened yogurt. Full-fat   cheeses or blue cheese. Nondairy creamers and whipped toppings. Processed cheese, cheese spreads, or cheese  curds. Condiments Onion and garlic salt, seasoned salt, table salt, and sea salt. Canned and packaged gravies. Worcestershire sauce. Tartar sauce. Barbecue sauce. Teriyaki sauce. Soy sauce, including reduced sodium. Steak sauce. Fish sauce. Oyster sauce. Cocktail sauce. Horseradish. Ketchup and mustard. Meat flavorings and tenderizers. Bouillon cubes. Hot sauce. Tabasco sauce. Marinades. Taco seasonings. Relishes. Fats and Oils Butter, stick margarine, lard, shortening, ghee, and bacon fat. Coconut, palm kernel, or palm oils. Regular salad dressings. Other Pickles and olives. Salted popcorn and pretzels. The items listed above may not be a complete list of foods and beverages to avoid. Contact your dietitian for more information. WHERE CAN I FIND MORE INFORMATION? National Heart, Lung, and Blood Institute: www.nhlbi.nih.gov/health/health-topics/topics/dash/ Document Released: 09/07/2011 Document Revised: 02/02/2014 Document Reviewed: 07/23/2013 ExitCare Patient Information 2015 [employer]. This information is not intended to replace advice given to you by your health care provider. Make sure you discuss any questions you have with your health care provider. Hypertension Hypertension, commonly called high blood pressure, is when the force of blood pumping through your arteries is too strong. Your arteries are the blood vessels that carry blood from your heart throughout your body. A blood pressure reading consists of a higher number over a lower number, such as 110/72. The higher number (systolic) is the pressure inside your arteries when your heart pumps. The lower number (diastolic) is the pressure inside your arteries when your heart relaxes. Ideally you want your blood pressure below 120/80. Hypertension forces your heart to work harder to pump blood. Your arteries may become narrow or stiff. Having hypertension puts you at risk for heart disease, stroke, and other problems.  RISK  FACTORS Some risk factors for high blood pressure are controllable. Others are not.  Risk factors you cannot control include:   Race. You may be at higher risk if you are African American.  Age. Risk increases with age.  Gender. Men are at higher risk than women before age 45 years. After age 65, women are at higher risk than men. Risk factors you can control include:  Not getting enough exercise or physical activity.  Being overweight.  Getting too much fat, sugar, calories, or salt in your diet.  Drinking too much alcohol. SIGNS AND SYMPTOMS Hypertension does not usually cause signs or symptoms. Extremely high blood pressure (hypertensive crisis) may cause headache, anxiety, shortness of breath, and nosebleed. DIAGNOSIS  To check if you have hypertension, your health care provider will measure your blood pressure while you are seated, with your arm held at the level of your heart. It should be measured at least twice using the same arm. Certain conditions can cause a difference in blood pressure between your right and left arms. A blood pressure reading that is higher than normal on one occasion does not mean that you need treatment. If one blood pressure reading is high, ask your health care provider about having it checked again. TREATMENT  Treating high blood pressure includes making lifestyle changes and possibly taking medicine. Living a healthy lifestyle can help lower high blood pressure. You may need to change some of your habits. Lifestyle changes may include:  Following the DASH diet. This diet is high in fruits, vegetables, and whole grains. It is low in salt, red meat, and added sugars.  Getting at least 2 hours of brisk physical activity every week.  Losing weight if necessary.  Not smoking.  Limiting   alcoholic beverages.  Learning ways to reduce stress. If lifestyle changes are not enough to get your blood pressure under control, your health care provider may  prescribe medicine. You may need to take more than one. Work closely with your health care provider to understand the risks and benefits. HOME CARE INSTRUCTIONS  Have your blood pressure rechecked as directed by your health care provider.   Take medicines only as directed by your health care provider. Follow the directions carefully. Blood pressure medicines must be taken as prescribed. The medicine does not work as well when you skip doses. Skipping doses also puts you at risk for problems.   Do not smoke.   Monitor your blood pressure at home as directed by your health care provider. SEEK MEDICAL CARE IF:   You think you are having a reaction to medicines taken.  You have recurrent headaches or feel dizzy.  You have swelling in your ankles.  You have trouble with your vision. SEEK IMMEDIATE MEDICAL CARE IF:  You develop a severe headache or confusion.  You have unusual weakness, numbness, or feel faint.  You have severe chest or abdominal pain.  You vomit repeatedly.  You have trouble breathing. MAKE SURE YOU:   Understand these instructions.  Will watch your condition.  Will get help right away if you are not doing well or get worse. Document Released: 09/18/2005 Document Revised: 02/02/2014 Document Reviewed: 07/11/2013 ExitCare Patient Information 2015 [employer]. This information is not intended to replace advice given to you by your health care provider. Make sure you discuss any questions you have with your health care provider.  

## 2014-09-07 NOTE — Progress Notes (Signed)
Chief Complaint:  Chief Complaint  Patient presents with  . Establish Care    follow HYPERTENSION    HPI: Savannah Peterson is a 58 y.o. female who is here for  HTN Has been on norvasc 5 mg but her BP  Has been running in the 150-170-s/90-100s.  Denies CP, SOB; has a slight  vertical HA. No N/v/abd pain She had an allergy to ACEI with cough so that was stopped, she currently does not have any SEs with the norvasc   BP Readings from Last 3 Encounters:  09/04/14 150/90  06/20/14 149/92  06/19/14 160/92    Wt Readings from Last 3 Encounters:  09/04/14 226 lb (102.513 kg)  06/20/14 231 lb (104.781 kg)  06/19/14 231 lb (104.781 kg)     Past Medical History  Diagnosis Date  . Hypertension    Past Surgical History  Procedure Laterality Date  . Tubal ligation  1982  . Orif ankle fracture  10/11/2012    Procedure: OPEN REDUCTION INTERNAL FIXATION (ORIF) ANKLE FRACTURE;  Surgeon: Kathryne Hitchhristopher Y Blackman, MD;  Location: [facility];  Service: Orthopedics;  Laterality: Right;  Open Reduction Internal Fixation Right Ankle Fracture  . Ankle arthroscopy Right 01/15/2014    Procedure: RIGHT ANKLE ARTHROSCOPY WITH DEBRIDEMENT, REMOVAL OF 2 SYNDESMOSIS SCREWS RIGHT ANKLE;  Surgeon: Kathryne Hitchhristopher Y Blackman, MD;  Location: [facility];  Service: Orthopedics;  Laterality: Right;  . Hardware removal Right 01/15/2014    Procedure: HARDWARE REMOVAL;  Surgeon: Kathryne Hitchhristopher Y Blackman, MD;  [facility];  Service: Orthopedics;  Laterality: Right;   History   Social History  . Marital Status: Divorced    Spouse Name: N/A    Number of Children: N/A  . Years of Education: N/A   Social History Main Topics  . Smoking status: Never Smoker   . Smokeless tobacco: Never Used  . Alcohol Use: No  . Drug Use: No  . Sexual Activity: Yes    Birth Control/ Protection: Surgical   Other Topics Concern  . None   Social History Narrative   Family History  Problem Relation Age of Onset  . Hypertension  Mother   . Hypertension Father    Allergies  Allergen Reactions  . Ace Inhibitors   . Asa [Aspirin]   . Penicillins Rash   Prior to Admission medications   Medication Sig Start Date End Date Taking? Authorizing Provider  amLODipine (NORVASC) 5 MG tablet Take 1 tablet (5 mg total) by mouth daily. 06/20/14  Yes Jonita Albeehris W Guest, MD  gabapentin (NEURONTIN) 100 MG capsule Take 100 mg by mouth 3 (three) times daily.   Yes Historical Provider, MD  Multiple Vitamin (MULTIVITAMIN WITH MINERALS) TABS Take 1 tablet by mouth daily.   Yes Historical Provider, MD  Fluticasone Propionate (FLONASE NA) Place 1 spray into the nose every morning.    Historical Provider, MD     ROS: The patient denies fevers, chills, night sweats, unintentional weight loss, chest pain, palpitations, wheezing, dyspnea on exertion, nausea, vomiting, abdominal pain, dysuria, hematuria, melena, numbness, weakness, or tingling.  All other systems have been reviewed and were otherwise negative with the exception of those mentioned in the HPI and as above.    PHYSICAL EXAM: Filed Vitals:   09/04/14 1525  BP: 150/90  Pulse: 97  Temp: 98.5 F (36.9 C)  Resp: 16   Filed Vitals:   09/04/14 1525  Height: 5\' 7"  (1.702 m)  Weight: 226 lb (102.513 kg)   Body  mass index is 35.39 kg/(m^2).  General: Alert, no acute distress HEENT:  Normocephalic, atraumatic, oropharynx patent. EOMI, PERRLA, fundo exam normal Cardiovascular:  Regular rate and rhythm, no rubs murmurs or gallops.  No Carotid bruits, radial pulse intact. No pedal edema.  Respiratory: Clear to auscultation bilaterally.  No wheezes, rales, or rhonchi.  No cyanosis, no use of accessory musculature GI: No organomegaly, abdomen is soft and non-tender, positive bowel sounds.  No masses. Skin: No rashes. Neurologic: Facial musculature symmetric. Psychiatric: Patient is appropriate throughout our interaction. Lymphatic: No cervical lymphadenopathy Musculoskeletal: Gait  intact.   LABS: Results for orders placed or performed in visit on 06/08/14  POCT urinalysis dipstick  Result Value Ref Range   Color, UA yellow    Clarity, UA cloudy    Glucose, UA neg    Bilirubin, UA neg    Ketones, UA neg    Spec Grav, UA 1.020    Blood, UA moderate    pH, UA 7.5    Protein, UA neg    Urobilinogen, UA 0.2    Nitrite, UA neg    Leukocytes, UA Negative   POCT UA - Microscopic Only  Result Value Ref Range   WBC, Ur, HPF, POC 0-1    RBC, urine, microscopic 0-2    Bacteria, U Microscopic trace    Mucus, UA neg    Epithelial cells, urine per micros 2-4    Crystals, Ur, HPF, POC neg    Casts, Ur, LPF, POC neg    Yeast, UA neg      EKG/XRAY:   Primary [facility].   ASSESSMENT/PLAN: Encounter Diagnosis  Name Primary?  . Essential hypertension Yes   Refilled meds with increase in norvasc Will call with BP logs in the next 48-72 hours.  Increase norvasc from 5 to 10 mg , BP goals dicussed.   Gross sideeffects, risk and benefits, and alternatives of medications d/w patient. Patient is aware that all medications have potential sideeffects and we are unable to predict every sideeffect or drug-drug interaction that may occur.  Hamilton CapriLE, Lauriana Denes PHUONG, DO 09/04/2014 3:54 PM

## 2014-10-09 ENCOUNTER — Ambulatory Visit: Payer: 59 | Admitting: Family Medicine

## 2014-10-19 ENCOUNTER — Other Ambulatory Visit (HOSPITAL_COMMUNITY): Payer: Self-pay | Admitting: Orthopaedic Surgery

## 2014-10-19 DIAGNOSIS — R52 Pain, unspecified: Secondary | ICD-10-CM

## 2014-11-03 ENCOUNTER — Ambulatory Visit (HOSPITAL_COMMUNITY)
Admission: RE | Admit: 2014-11-03 | Discharge: 2014-11-03 | Disposition: A | Discharge status: Home / Self Care | Payer: PRIVATE HEALTH INSURANCE | Source: Ambulatory Visit | Attending: Orthopaedic Surgery | Admitting: Orthopaedic Surgery

## 2014-11-03 DIAGNOSIS — M4806 Spinal stenosis, lumbar region: Secondary | ICD-10-CM | POA: Diagnosis not present

## 2014-11-03 DIAGNOSIS — M545 Low back pain: Secondary | ICD-10-CM | POA: Insufficient documentation

## 2014-11-03 DIAGNOSIS — R52 Pain, unspecified: Secondary | ICD-10-CM | POA: Diagnosis present

## 2014-12-11 ENCOUNTER — Ambulatory Visit (INDEPENDENT_AMBULATORY_CARE_PROVIDER_SITE_OTHER): Payer: 59 | Admitting: Family Medicine

## 2014-12-11 VITALS — BP 157/97 | HR 80 | Temp 97.9°F | Resp 16 | Ht 67.0 in | Wt 225.0 lb

## 2014-12-11 DIAGNOSIS — I10 Essential (primary) hypertension: Secondary | ICD-10-CM

## 2014-12-11 DIAGNOSIS — R350 Frequency of micturition: Secondary | ICD-10-CM | POA: Diagnosis not present

## 2014-12-11 DIAGNOSIS — S82891A Other fracture of right lower leg, initial encounter for closed fracture: Secondary | ICD-10-CM

## 2014-12-11 DIAGNOSIS — M25571 Pain in right ankle and joints of right foot: Secondary | ICD-10-CM | POA: Diagnosis not present

## 2014-12-11 LAB — COMPLETE METABOLIC PANEL WITH GFR
AST: 18 U/L (ref 0–37)
Albumin: 4 g/dL (ref 3.5–5.2)
BUN: 12 mg/dL (ref 6–23)
CO2: 26 mEq/L (ref 19–32)
Calcium: 9.5 mg/dL (ref 8.4–10.5)
Chloride: 103 mEq/L (ref 96–112)
Creat: 0.73 mg/dL (ref 0.50–1.10)
GFR, Est African American: >89 mL/min
GFR, Est Non African American: >89 mL/min
Glucose, Bld: 91 mg/dL (ref 70–99)
Potassium: 4.2 mEq/L (ref 3.5–5.3)

## 2014-12-11 LAB — POCT URINALYSIS DIPSTICK
Bilirubin, UA: NEGATIVE
Blood, UA: NEGATIVE
Glucose, UA: NEGATIVE
Ketones, UA: NEGATIVE
Leukocytes, UA: NEGATIVE
Nitrite, UA: NEGATIVE
Protein, UA: NEGATIVE
Spec Grav, UA: 1.015
Urobilinogen, UA: 0.2
pH, UA: 6

## 2014-12-11 LAB — POCT UA - MICROSCOPIC ONLY
Casts, Ur, LPF, POC: NEGATIVE
Crystals, Ur, HPF, POC: NEGATIVE
Mucus, UA: POSITIVE
Yeast, UA: NEGATIVE

## 2014-12-11 LAB — COMPLETE METABOLIC PANEL WITHOUT GFR
ALT: 16 U/L (ref 0–35)
Alkaline Phosphatase: 61 U/L (ref 39–117)
Sodium: 140 meq/L (ref 135–145)
Total Bilirubin: 0.4 mg/dL (ref 0.2–1.2)
Total Protein: 7.1 g/dL (ref 6.0–8.3)

## 2014-12-11 LAB — GLUCOSE, POCT (MANUAL RESULT ENTRY): POC Glucose: 97 mg/dl (ref 70–99)

## 2014-12-11 MED ORDER — AMLODIPINE BESYLATE 10 MG PO TABS: 10.0000 mg | ORAL_TABLET | Freq: Every day | ORAL | Status: DC

## 2014-12-11 MED ORDER — HYDROCHLOROTHIAZIDE 12.5 MG PO CAPS: 12.5000 mg | ORAL_CAPSULE | Freq: Every day | ORAL | Status: DC

## 2014-12-11 NOTE — Progress Notes (Signed)
Chief Complaint:  Chief Complaint  Patient presents with  . Medication Refill  . Hypertension  . Follow-up    HPI: Savannah Peterson is a 59 y.o. female who is here for HTN and also dysuria She had a right  Right ankle ORIF  Dr. Magnus Ivan in January 2014 status post fall on the parking lot pavement.   She is in constant pain and   And she thinks this iskeeping her blood pRessure sky high and she is going to be "dead"  From this. She has 03/26/09  Pain ,numbness, tingling and weakness. She feels soemtimes she can stand and soemtimes it wants to give out and she needs to hlod on to something. She has pain going up the side of her leg.  She has had aan MRI of her back which she deneis she  Has any back problems.  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 The numbness and tingling and pain she feels radiates from her ankle and goes upward into her lower leg.  It is not other way around. Had an MRI which showed significant spinal stenosis and mild DJD. However she states that she does not have back issues or hip issues.  she is currently taking Norvasc 10 mg by mouth daily, no side effects, no complications. Her blood pressures are high at home. There are greater than 150/90.   Past Medical History  Diagnosis Date  . Hypertension    Past Surgical History  Procedure Laterality Date  . Tubal ligation  1982  . Orif ankle fracture  10/11/2012    Procedure: OPEN REDUCTION INTERNAL FIXATION (ORIF) ANKLE FRACTURE;  Surgeon: Kathryne Hitch, MD;  Location: [facility];  Service: Orthopedics;  Laterality: Right;  Open Reduction Internal Fixation Right Ankle Fracture  . Ankle arthroscopy Right 01/15/2014    Procedure: RIGHT ANKLE ARTHROSCOPY WITH DEBRIDEMENT, REMOVAL OF 2 SYNDESMOSIS SCREWS RIGHT ANKLE;  Surgeon: Kathryne Hitch, MD;  Location: [facility] OR;  Service: Orthopedics;  Laterality: Right;  . Hardware removal Right 01/15/2014      Procedure: HARDWARE REMOVAL;  Surgeon: Kathryne Hitch, MD;  Location: [facility] OR;  Service: Orthopedics;  Laterality: Right;   History   Social History  . Marital Status: Divorced    Spouse Name: N/A  . Number of Children: N/A  . Years of Education: N/A   Social History Main Topics  . Smoking status: Never Smoker   . Smokeless tobacco: Never Used  . Alcohol Use: No  . Drug Use: No  . Sexual Activity: Yes    Birth Control/ Protection: Surgical   Other Topics Concern  . Not on file   Social History Narrative   Family History  Problem Relation Age of Onset  . Hypertension Mother   . Hypertension Father    Allergies  Allergen Reactions  . Ace Inhibitors   . Asa [Aspirin]   . Penicillins Rash   Prior to Admission medications   Medication Sig Start Date End Date Taking? Authorizing Provider  amLODipine (NORVASC) 10 MG tablet Take 1 tablet (10 mg total) by mouth daily. 09/04/14   Anala Whisenant P Indalecio Malmstrom, DO  Multiple Vitamin (MULTIVITAMIN WITH MINERALS) TABS Take 1 tablet by mouth daily.    Historical Provider, MD     ROS: The patient denies fevers, chills, night sweats, unintentional weight loss, chest pain, palpitations, wheezing, dyspnea on exertion, nausea, vomiting, abdominal pain,  dysuria, hematuria, melena, + numbness, weakness, or tingling.   All other systems have been reviewed and were otherwise negative with the exception of those mentioned in the HPI and as above.    PHYSICAL EXAM: Filed Vitals:   12/11/14 1207  BP: 157/97  Pulse: 80  Temp: 97.9 F (36.6 C)  Resp: 16   Filed Vitals:   12/11/14 1207  Height:  (1.702 m)  Weight: 225 lb (102.059 kg)   Body mass index is 35.23 kg/(m^2).  General: Alert, no acute distress HEENT:  Normocephalic, atraumatic, oropharynx patent. EOMI, PERRLA Cardiovascular:  Regular rate and rhythm, no rubs murmurs or gallops.  No Carotid bruits, radial pulse intact. No pedal edema.  Respiratory: Clear to auscultation  bilaterally.  No wheezes, rales, or rhonchi.  No cyanosis, no use of accessory musculature GI: No organomegaly, abdomen is soft and non-tender, positive bowel sounds.  No masses. Skin: No rashes. Neurologic: Facial musculature symmetric. Psychiatric: Patient is appropriate throughout our interaction. Lymphatic: No cervical lymphadenopathy Musculoskeletal: Gait  Antalgic.  Good dorsi and plantar flexion.  No appreciable ankle swelling, erythema, warmth. The wounds look intact.   LABS: Results for orders placed or performed in visit on 12/11/14  POCT glucose (manual entry)  Result Value Ref Range   POC Glucose 97 70 - 99 mg/dl  POCT UA - Microscopic Only  Result Value Ref Range   WBC, Ur, HPF, POC 0-1    RBC, urine, microscopic 1-2    Bacteria, U Microscopic 1+    Mucus, UA pos    Epithelial cells, urine per micros 2-3    Crystals, Ur, HPF, POC neg    Casts, Ur, LPF, POC neg    Yeast, UA neg   POCT urinalysis dipstick  Result Value Ref Range   Color, UA yellow    Clarity, UA clear    Glucose, UA neg    Bilirubin, UA neg    Ketones, UA neg    Spec Grav, UA 1.015    Blood, UA neg    pH, UA 6.0    Protein, UA neg    Urobilinogen, UA 0.2    Nitrite, UA neg    Leukocytes, UA Negative      EKG/XRAY:   Primary read interpreted by Dr. Conley Rolls at [facility].   ASSESSMENT/PLAN: Encounter Diagnoses  Name Primary?  . Essential hypertension Yes  . Increased urinary frequency   . Right ankle pain   . Ankle fracture, right     This is a pleasant 59 year old African-American female with a past medical history of essential hypertension, right ankle ORIF status post fall in 2014.  Her blood pressure is poorly controlled in part due to pain. She would like to get a second opinion about her ankle. Does not think that this pain is radiating from her back. Not want a steroid injection in her back.  She had a recent MRI which showed spinal stenosis, DJD and a small bulging disc at L3-L4. Patient   Denies any back pain.  Will refer her to [facility] orthopedics Dr. Victorino Dike foot and ankle specialist for a second opinion  I will add hydrochlorothiazide 12.5 mg daily health control her blood pressure.   Continue with Norvasc 10 mg by mouth daily. She will continue with any pain medication she has.  Follow-up  For scheduled appointment.  Gross sideeffects, risk and benefits, and alternatives of medications d/w patient. Patient is aware that all medications have potential sideeffects and we are unable to predict  every sideeffect or drug-drug interaction that may occur.  Hamilton CapriLE, Patricia Perales PHUONG, DO 12/11/2014 4:22 PM

## 2014-12-13 ENCOUNTER — Encounter: Payer: Self-pay | Admitting: Family Medicine

## 2015-01-29 ENCOUNTER — Encounter: Payer: Self-pay | Admitting: Family Medicine

## 2015-01-29 ENCOUNTER — Ambulatory Visit (INDEPENDENT_AMBULATORY_CARE_PROVIDER_SITE_OTHER): Payer: 59 | Admitting: Family Medicine

## 2015-01-29 VITALS — BP 130/90 | HR 68 | Temp 97.9°F | Resp 16 | Ht 67.25 in | Wt 223.0 lb

## 2015-01-29 DIAGNOSIS — Z1329 Encounter for screening for other suspected endocrine disorder: Secondary | ICD-10-CM | POA: Diagnosis not present

## 2015-01-29 DIAGNOSIS — Z13220 Encounter for screening for lipoid disorders: Secondary | ICD-10-CM | POA: Diagnosis not present

## 2015-01-29 DIAGNOSIS — I10 Essential (primary) hypertension: Secondary | ICD-10-CM | POA: Diagnosis not present

## 2015-01-29 DIAGNOSIS — Z0000 Encounter for general adult medical examination without abnormal findings: Secondary | ICD-10-CM

## 2015-01-29 DIAGNOSIS — Z1239 Encounter for other screening for malignant neoplasm of breast: Secondary | ICD-10-CM

## 2015-01-29 DIAGNOSIS — Z124 Encounter for screening for malignant neoplasm of cervix: Secondary | ICD-10-CM | POA: Diagnosis not present

## 2015-01-29 DIAGNOSIS — Z130 Encounter for screening for diseases of the blood and blood-forming organs and certain disorders involving the immune mechanism: Secondary | ICD-10-CM

## 2015-01-29 DIAGNOSIS — Z1211 Encounter for screening for malignant neoplasm of colon: Secondary | ICD-10-CM | POA: Diagnosis not present

## 2015-01-29 LAB — POCT URINALYSIS DIPSTICK
Bilirubin, UA: NEGATIVE
Blood, UA: NEGATIVE
Glucose, UA: NEGATIVE
Ketones, UA: NEGATIVE
Nitrite, UA: NEGATIVE
Protein, UA: NEGATIVE
Spec Grav, UA: 1.015
Urobilinogen, UA: 0.2
pH, UA: 6

## 2015-01-29 LAB — CBC WITH DIFFERENTIAL/PLATELET
Basophils Absolute: 0 10*3/uL (ref 0.0–0.1)
Basophils Relative: 0 % (ref 0–1)
Eosinophils Absolute: 0.1 10*3/uL (ref 0.0–0.7)
Eosinophils Relative: 2 % (ref 0–5)
HCT: 40.8 % (ref 36.0–46.0)
Hemoglobin: 13.7 g/dL (ref 12.0–15.0)
Lymphocytes Relative: 30 % (ref 12–46)
Lymphs Abs: 1.8 10*3/uL (ref 0.7–4.0)
MCH: 31.1 pg (ref 26.0–34.0)
MCHC: 33.6 g/dL (ref 30.0–36.0)
MCV: 92.7 fL (ref 78.0–100.0)
MPV: 8.9 fL (ref 8.6–12.4)
Monocytes Absolute: 0.5 10*3/uL (ref 0.1–1.0)
Monocytes Relative: 8 % (ref 3–12)
Neutro Abs: 3.6 10*3/uL (ref 1.7–7.7)
Neutrophils Relative %: 60 % (ref 43–77)
Platelets: 409 10*3/uL — ABNORMAL HIGH (ref 150–400)
RBC: 4.4 MIL/uL (ref 3.87–5.11)
RDW: 14.2 % (ref 11.5–15.5)
WBC: 6 10*3/uL (ref 4.0–10.5)

## 2015-01-29 LAB — LIPID PANEL
Cholesterol: 234 mg/dL — ABNORMAL HIGH (ref 0–200)
HDL: 75 mg/dL (ref >=46)
LDL Cholesterol: 146 mg/dL — ABNORMAL HIGH (ref 0–99)
Total CHOL/HDL Ratio: 3.1 Ratio
Triglycerides: 66 mg/dL (ref <150)
VLDL: 13 mg/dL (ref 0–40)

## 2015-01-29 LAB — POCT UA - MICROSCOPIC ONLY
Casts, Ur, LPF, POC: NEGATIVE
Crystals, Ur, HPF, POC: NEGATIVE
Epithelial cells, urine per micros: NEGATIVE
Mucus, UA: POSITIVE

## 2015-01-29 LAB — COMPLETE METABOLIC PANEL WITHOUT GFR
AST: 17 U/L (ref 0–37)
BUN: 12 mg/dL (ref 6–23)
Calcium: 9.8 mg/dL (ref 8.4–10.5)
Potassium: 4.2 meq/L (ref 3.5–5.3)
Total Bilirubin: 0.5 mg/dL (ref 0.2–1.2)
Total Protein: 7.1 g/dL (ref 6.0–8.3)

## 2015-01-29 LAB — COMPLETE METABOLIC PANEL WITH GFR
ALT: 14 U/L (ref 0–35)
Albumin: 3.7 g/dL (ref 3.5–5.2)
Alkaline Phosphatase: 65 U/L (ref 39–117)
CO2: 24 mEq/L (ref 19–32)
Chloride: 103 mEq/L (ref 96–112)
Creat: 0.71 mg/dL (ref 0.50–1.10)
GFR, Est African American: >89 mL/min
GFR, Est Non African American: >89 mL/min
Glucose, Bld: 90 mg/dL (ref 70–99)
Sodium: 141 mEq/L (ref 135–145)

## 2015-01-29 LAB — TSH: TSH: 1.706 u[IU]/mL (ref 0.350–4.500)

## 2015-01-29 MED ORDER — HYDROCHLOROTHIAZIDE 12.5 MG PO CAPS: 12.5000 mg | ORAL_CAPSULE | Freq: Every day | ORAL | Status: DC

## 2015-01-29 NOTE — Progress Notes (Signed)
Chief Complaint:  Chief Complaint  Patient presents with  . Annual Exam    with PAP SMEAR    HPI: Savannah Peterson is a 59 y.o. female who is here for CPE No depression symptoms Last mammogram 2012 was normal No prior colonscopy LMP was 10 years ago No personal or family history of cervical uterine colon breast cancer  Last pap 2010 normal UTD on tetanus in 2014 after ankle injury, not sure [id number] She is up-to-date on her tetanus after her ankle injury in 2014 and also her eye exam  Past Medical History  Diagnosis Date  . Hypertension   . Allergy     seasonal   Past Surgical History  Procedure Laterality Date  . Tubal ligation  1982  . Orif ankle fracture  10/11/2012    Procedure: OPEN REDUCTION INTERNAL FIXATION (ORIF) ANKLE FRACTURE;  Surgeon: Mcarthur Rossetti, MD;  Location: [facility];  Service: Orthopedics;  Laterality: Right;  Open Reduction Internal Fixation Right Ankle Fracture  . Ankle arthroscopy Right 01/15/2014    Procedure: RIGHT ANKLE ARTHROSCOPY WITH DEBRIDEMENT, REMOVAL OF 2 SYNDESMOSIS SCREWS RIGHT ANKLE;  Surgeon: Mcarthur Rossetti, MD;  Location: [facility];  Service: Orthopedics;  Laterality: Right;  . Hardware removal Right 01/15/2014    Procedure: HARDWARE REMOVAL;  Surgeon: Mcarthur Rossetti, MD;  Location: [facility];  Service: Orthopedics;  Laterality: Right;   History   Social History  . Marital Status: Divorced    Spouse Name: N/A  . Number of Children: N/A  . Years of Education: N/A   Social History Main Topics  . Smoking status: Never Smoker   . Smokeless tobacco: Never Used  . Alcohol Use: No  . Drug Use: No  . Sexual Activity: Yes    Birth Control/ Protection: Surgical   Other Topics Concern  . None   Social History Narrative   Family History  Problem Relation Age of Onset  . Hypertension Mother   . Hyperlipidemia Mother   . Hyperlipidemia Sister   . Hypertension Sister   . Hyperlipidemia Brother   . Hypertension  Brother   . Hyperlipidemia Sister   . Hypertension Sister    Allergies  Allergen Reactions  . Ace Inhibitors   . Asa [Aspirin]   . Penicillins Rash   Prior to Admission medications   Medication Sig Start Date End Date Taking? Authorizing Provider  amLODipine (NORVASC) 10 MG tablet Take 1 tablet (10 mg total) by mouth daily. 12/11/14  Yes Traeson Dusza P Sussan Meter, DO  hydrochlorothiazide (MICROZIDE) 12.5 MG capsule Take 1 capsule (12.5 mg total) by mouth daily. 12/11/14  Yes Toma Erichsen P Wilmina Maxham, DO  HYDROcodone-acetaminophen (NORCO/VICODIN) 5-325 MG per tablet Take 1 tablet by mouth every 6 (six) hours as needed for moderate pain.   Yes Historical Provider, MD  Multiple Vitamin (MULTIVITAMIN WITH MINERALS) TABS Take 1 tablet by mouth daily.   Yes Historical Provider, MD     ROS: The patient denies fevers, chills, night sweats, unintentional weight loss, chest pain, palpitations, wheezing, dyspnea on exertion, nausea, vomiting, abdominal pain, dysuria, hematuria, melena, numbness, weakness, or tingling.   All other systems have been reviewed and were otherwise negative with the exception of those mentioned in the HPI and as above.    PHYSICAL EXAM: Filed Vitals:   01/29/15 0830  BP: 130/90  Pulse: 68  Temp: 97.9 F (36.6 C)  Resp: 16   Filed Vitals:   01/29/15 0830  Height: 5' 7.25" (1.708  m)  Weight: 223 lb (101.152 kg)   Body mass index is 34.67 kg/(m^2).  General: Alert, no acute distress HEENT:  Normocephalic, atraumatic, oropharynx patent. EOMI, PERRLA, funduscopic exam normal Cardiovascular:  Regular rate and rhythm, no rubs murmurs or gallops.  No Carotid bruits, radial pulse intact. No pedal edema.  Respiratory: Clear to auscultation bilaterally.  No wheezes, rales, or rhonchi.  No cyanosis, no use of accessory musculature GI: No organomegaly, abdomen is soft and non-tender, positive bowel sounds.  No masses. Skin: No rashes. Neurologic: Facial musculature symmetric. Psychiatric: Patient is  appropriate throughout our interaction. Lymphatic: No cervical lymphadenopathy Musculoskeletal: Gait intact. Breast exam normal Cervical exam normal   LABS: Results for orders placed or performed in visit on 01/29/15  COMPLETE METABOLIC PANEL WITH GFR  Result Value Ref Range   Sodium 141 135 - 145 mEq/L   Potassium 4.2 3.5 - 5.3 mEq/L   Chloride 103 96 - 112 mEq/L   CO2 24 19 - 32 mEq/L   Glucose, Bld 90 70 - 99 mg/dL   BUN 12 6 - 23 mg/dL   Creat 0.71 0.50 - 1.10 mg/dL   Total Bilirubin 0.5 0.2 - 1.2 mg/dL   Alkaline Phosphatase 65 39 - 117 U/L   AST 17 0 - 37 U/L   ALT 14 0 - 35 U/L   Total Protein 7.1 6.0 - 8.3 g/dL   Albumin 3.7 3.5 - 5.2 g/dL   Calcium 9.8 8.4 - 10.5 mg/dL   GFR, Est African American >89 mL/min   GFR, Est Non African American >89 mL/min  CBC with Differential/Platelet  Result Value Ref Range   WBC 6.0 4.0 - 10.5 K/uL   RBC 4.40 3.87 - 5.11 MIL/uL   Hemoglobin 13.7 12.0 - 15.0 g/dL   HCT 40.8 36.0 - 46.0 %   MCV 92.7 78.0 - 100.0 fL   MCH 31.1 26.0 - 34.0 pg   MCHC 33.6 30.0 - 36.0 g/dL   RDW 14.2 11.5 - 15.5 %   Platelets 409 (H) 150 - 400 K/uL   MPV 8.9 8.6 - 12.4 fL   Neutrophils Relative % 60 43 - 77 %   Neutro Abs 3.6 1.7 - 7.7 K/uL   Lymphocytes Relative 30 12 - 46 %   Lymphs Abs 1.8 0.7 - 4.0 K/uL   Monocytes Relative 8 3 - 12 %   Monocytes Absolute 0.5 0.1 - 1.0 K/uL   Eosinophils Relative 2 0 - 5 %   Eosinophils Absolute 0.1 0.0 - 0.7 K/uL   Basophils Relative 0 0 - 1 %   Basophils Absolute 0.0 0.0 - 0.1 K/uL   Smear Review Criteria for review not met   Lipid panel  Result Value Ref Range   Cholesterol 234 (H) 0 - 200 mg/dL   Triglycerides 66 <150 mg/dL   HDL 75 >=46 mg/dL   Total CHOL/HDL Ratio 3.1 Ratio   VLDL 13 0 - 40 mg/dL   LDL Cholesterol 146 (H) 0 - 99 mg/dL  TSH  Result Value Ref Range   TSH 1.706 0.350 - 4.500 uIU/mL  POCT UA - Microscopic Only  Result Value Ref Range   WBC, Ur, HPF, POC 0-1    RBC, urine,  microscopic 0-1    Bacteria, U Microscopic Trace    Mucus, UA pos    Epithelial cells, urine per micros Negative    Crystals, Ur, HPF, POC Negative    Casts, Ur, LPF, POC Negative    Yeast,  UA    POCT urinalysis dipstick  Result Value Ref Range   Color, UA Yellow    Clarity, UA Clear    Glucose, UA Negative    Bilirubin, UA Negative    Ketones, UA Negative    Spec Grav, UA 1.015    Blood, UA Negative    pH, UA 6.0    Protein, UA Negative    Urobilinogen, UA 0.2    Nitrite, UA Negative    Leukocytes, UA Trace   Pap IG, CT/NG w/ reflex HPV when ASC-U  Result Value Ref Range   Specimen adequacy: SEE NOTE    FINAL DIAGNOSIS: SEE NOTE    Cytotechnologist: SEE NOTE    Chlamydia Probe Amp NEGATIVE    GC Probe Amp NEGATIVE      EKG/XRAY:   Primary read interpreted by Dr. Marin Comment at [facility].   ASSESSMENT/PLAN: Encounter Diagnoses  Name Primary?  . Essential hypertension Yes  . Screening for deficiency anemia   . Screening for hyperlipidemia   . Screening for thyroid disorder   . Special screening for malignant neoplasms, colon   . Screening for cervical cancer   . Visit for annual health examination   . Screening for breast cancer    59 year old African-American female here for annual physical. She is doing well. She is planning to get a mammogram at the [facility] where she normally gets it We have referred her to get a screening colonoscopy She has declined to get any pneumonia vaccinations at this time she will wait until she is 25. Annual labs pending She has a second opinion a point with Dr. Doran Durand for her ankle at the end of the month She is inquiring about a temporary parking pass, I have advised her that either Dr. Ninfa Linden or he would would be better at filling this out for her. If she cannot get access to the office in a timely manner than I can fill it out. She needs to get the forms through the [facility] website. Follow-up when necessary otherwise in 1 year  Gross  sideeffects, risk and benefits, and alternatives of medications d/w patient. Patient is aware that all medications have potential sideeffects and we are unable to predict every sideeffect or drug-drug interaction that may occur.  Dessirae Scarola, [place], DO 02/02/2015 10:22 AM

## 2015-02-01 LAB — PAP IG, CT-NG, RFX HPV ASCU
Chlamydia Probe Amp: NEGATIVE
GC Probe Amp: NEGATIVE

## 2015-02-05 ENCOUNTER — Encounter: Payer: Self-pay | Admitting: Internal Medicine

## 2015-02-09 ENCOUNTER — Encounter: Payer: Self-pay | Admitting: Family Medicine

## 2015-03-29 ENCOUNTER — Ambulatory Visit (AMBULATORY_SURGERY_CENTER): Payer: Self-pay | Admitting: *Deleted

## 2015-03-29 VITALS — Ht 67.0 in | Wt 222.2 lb

## 2015-03-29 DIAGNOSIS — Z1211 Encounter for screening for malignant neoplasm of colon: Secondary | ICD-10-CM

## 2015-03-29 NOTE — Progress Notes (Signed)
No issues with past sedation No egg or soy allergy No home 02 No diet pills emmi declined

## 2015-04-08 ENCOUNTER — Telehealth: Payer: Self-pay | Admitting: Internal Medicine

## 2015-04-08 NOTE — Telephone Encounter (Signed)
No charge. 

## 2015-04-12 ENCOUNTER — Encounter: Payer: 59 | Admitting: Internal Medicine

## 2015-05-03 ENCOUNTER — Telehealth: Payer: Self-pay

## 2015-05-03 NOTE — Telephone Encounter (Signed)
Patient had a colonoscopy scheduled last month and is calling because she decided she would rather do it at home. Patient wants to try something called colonguard and would like to see if a provider can approve it. Please call! [phone]

## 2015-05-03 NOTE — Telephone Encounter (Signed)
Cologuard is an easy to use, noninvasive colon cancer screening test based on the latest advances in stool DNA science. It is for adults 50 years or older who are at average risk for colon cancer, and it is available by prescription only.  Please advise.

## 2015-05-19 NOTE — Telephone Encounter (Signed)
Can you let Savannah Peterson know that there are pros and cons to the cologuard. I briefly went through the major ones yesterday. Pros-less invasive Cons-only good for 3 years, insurance may or may not pay for all, none or part of it.,  less sensitive screening, strict inclusion criteria We can go ahead and order it for her and if she meets all the criteria and is willing to pay whatever it is out of pocket once we get the quote for her part then we can proceed. If she wants to do the colonscopy then ok with me too. I have talked briefly with her about it yesterday. I have no preference except that she gets screened for colon cancer.   Thanks

## 2015-05-20 ENCOUNTER — Telehealth: Payer: Self-pay | Admitting: Family Medicine

## 2015-05-20 NOTE — Telephone Encounter (Signed)
i spoke with patient she says to go on with the quote i dont know how to do it or who top call or if the patient suppose to call please respond

## 2015-05-21 NOTE — Telephone Encounter (Signed)
Called and spoke with pt and she is aware of number to call to get more information about the cologuard.  [phone] and if she wants to call her insurance company she will just need to let them know that this will be filed under CPT code 09811.  Pt voiced her understanding and will call back if anything further is needed

## 2015-05-21 NOTE — Telephone Encounter (Signed)
Can you guys order the cologuard for Savannah Peterson. Thanks

## 2015-05-21 NOTE — Telephone Encounter (Signed)
Form sent to cologuard.  They will contact the patient.

## 2015-05-21 NOTE — Telephone Encounter (Signed)
Spoke to patient. She wants to try the cologuard.  She said she spoke to someone who told her that they would get a quote for her regarding her insurance coverage and copayment.  I told her that I would send this message back to Dr. Conley Rolls for the referral to be placed and then the referrals department would return her call.

## 2015-07-03 ENCOUNTER — Telehealth: Payer: Self-pay | Admitting: Family Medicine

## 2015-07-03 NOTE — Telephone Encounter (Signed)
lmom for patient to call and reschedule her appointment with Dr Katrinka Blazing for and OV

## 2015-07-26 ENCOUNTER — Ambulatory Visit: Payer: 59 | Admitting: Family Medicine

## 2015-08-12 ENCOUNTER — Telehealth: Payer: Self-pay | Admitting: Family Medicine

## 2015-08-12 NOTE — Telephone Encounter (Signed)
Spoke with patient and she is going to call [facility] and set this up.  She will call back with appointment date and have them fax us a copy of the report

## 2015-08-20 ENCOUNTER — Ambulatory Visit: Payer: 59 | Admitting: Family Medicine

## 2015-09-28 ENCOUNTER — Other Ambulatory Visit: Payer: Self-pay | Admitting: Family Medicine

## 2015-09-28 ENCOUNTER — Telehealth: Payer: Self-pay | Admitting: *Deleted

## 2015-09-28 NOTE — Telephone Encounter (Signed)
Spoke with patient called in 15 tabs, patient aware she will need to come for refills

## 2015-10-21 ENCOUNTER — Ambulatory Visit: Payer: Self-pay

## 2015-12-14 ENCOUNTER — Encounter: Payer: Self-pay | Admitting: Neurology

## 2015-12-14 ENCOUNTER — Ambulatory Visit (INDEPENDENT_AMBULATORY_CARE_PROVIDER_SITE_OTHER): Payer: Self-pay | Admitting: Neurology

## 2015-12-14 VITALS — BP 160/93 | HR 69 | Ht 67.0 in | Wt 221.0 lb

## 2015-12-14 DIAGNOSIS — R519 Headache, unspecified: Secondary | ICD-10-CM | POA: Insufficient documentation

## 2015-12-14 DIAGNOSIS — M542 Cervicalgia: Secondary | ICD-10-CM

## 2015-12-14 DIAGNOSIS — R51 Headache: Secondary | ICD-10-CM

## 2015-12-14 DIAGNOSIS — M129 Arthropathy, unspecified: Secondary | ICD-10-CM

## 2015-12-14 DIAGNOSIS — G44209 Tension-type headache, unspecified, not intractable: Secondary | ICD-10-CM

## 2015-12-14 DIAGNOSIS — M19071 Primary osteoarthritis, right ankle and foot: Secondary | ICD-10-CM

## 2015-12-14 MED ORDER — NORTRIPTYLINE HCL 25 MG PO CAPS: ORAL_CAPSULE | ORAL | Status: DC
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
## 2016-02-08 ENCOUNTER — Telehealth: Payer: Self-pay | Admitting: Neurology

## 2016-02-08 NOTE — Telephone Encounter (Signed)
Pt called said she came in clinic 01/31/16 and signed MR for her records. Operator explained there could be a charge for records. Pt also said her atty Kathlynn GrateMark Gray requested her records 01/04/16. She talked with him last week and he has not rec'd the records and that is holding up her case from moving forward. Please call

## 2016-02-14 NOTE — Telephone Encounter (Signed)
Pt called in to speak with MR. Please call

## 2016-02-15 ENCOUNTER — Telehealth: Payer: Self-pay | Admitting: *Deleted

## 2016-02-15 NOTE — Telephone Encounter (Signed)
------------------------------------------------------------   Josefine ClassCaller Savana Huish            CID [id number]  Patient SAME                 Pt's Dr UNSURE       Area Code 336 Phone# [phone] * DOB [phone]    RE HAS BEEN REQUESTING HER RECORDS FOR OVER A        MONTH NOW PCB FOR PICK DATE AND TIME                 Disp:Y/N N If Y = C/B If No Response In 20minutes ============================================================ Pt records at front desk for pick up.

## 2016-10-02 IMAGING — MR MR LUMBAR SPINE W/O CM
4 of 5 series · 19 of 48 positions shown · non-contrast
Comparison: None.

CLINICAL DATA: Low back pain. Numbness and tingling radiating down
right leg.

EXAM:
MRI LUMBAR SPINE WITHOUT CONTRAST
TECHNIQUE: Multiplanar, multisequence MR imaging of the lumbar spine was
performed. No intravenous contrast was administered.

[Series 4: T1 · sagittal · 4.0mm · 0.55mm/px · 3 of 12 slices shown (1 of 2)]
[im 1/12]
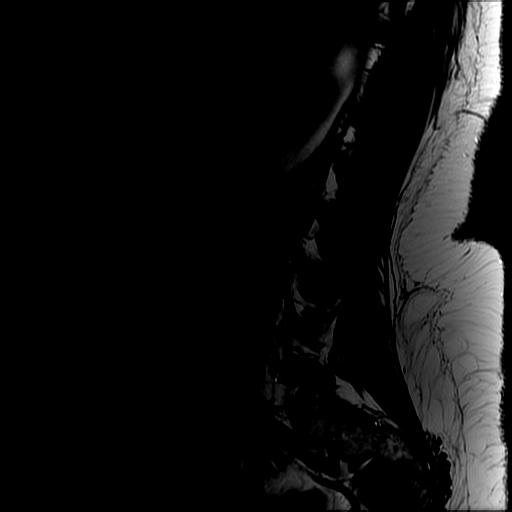
[im 8/12]
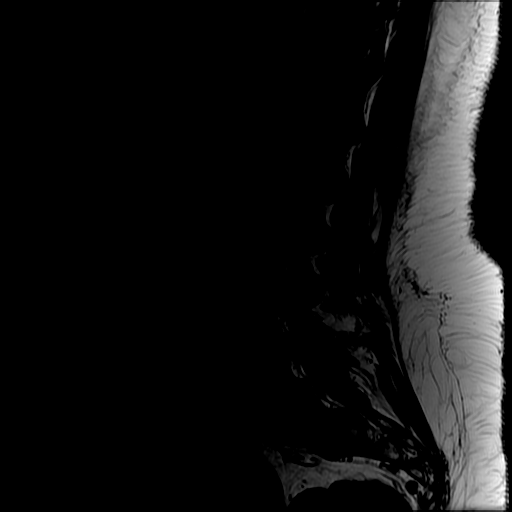
[im 12/12]
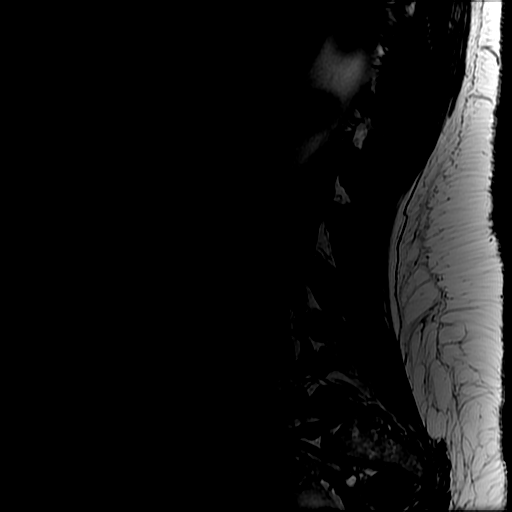

[Series 5: T2 post-contrast · sagittal · 4.0mm · 0.55mm/px · 5 of 12 slices shown]
[im 1/12]
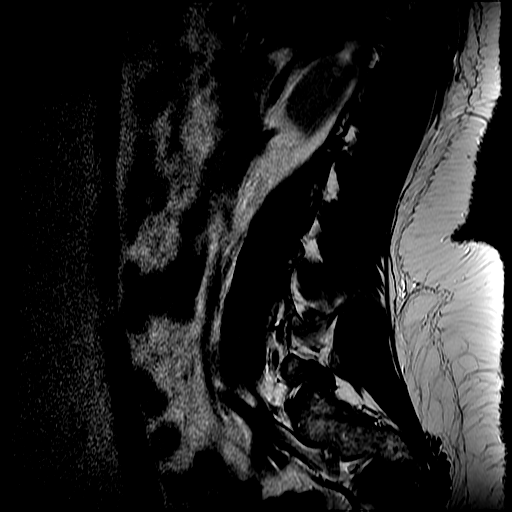
[im 3/12]
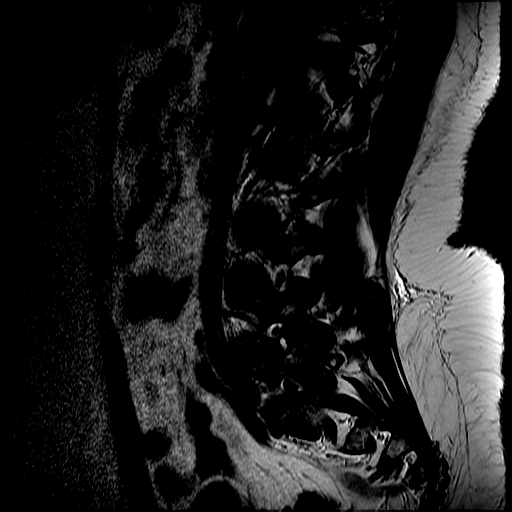
[im 6/12]
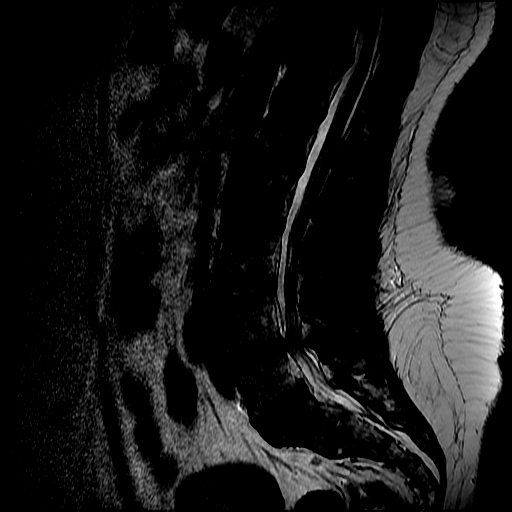
[im 9/12]
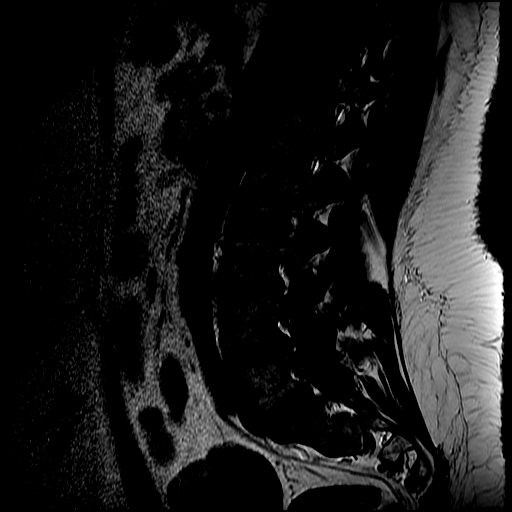
[im 12/12]
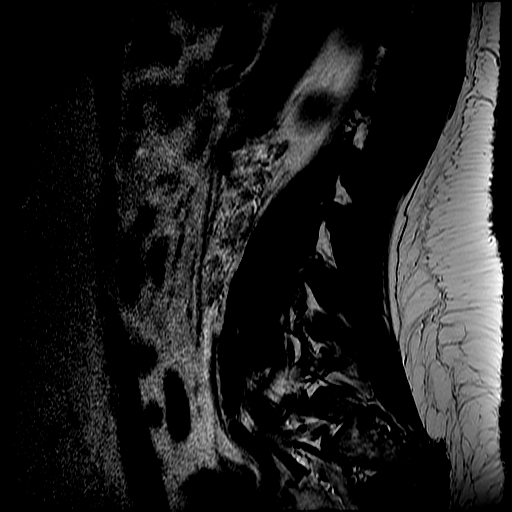

[Series 7: T2 · axial · 4.0mm · 0.39mm/px · z∈[-8,+178]mm · 8 of 40 slices shown]
[im 3/40]
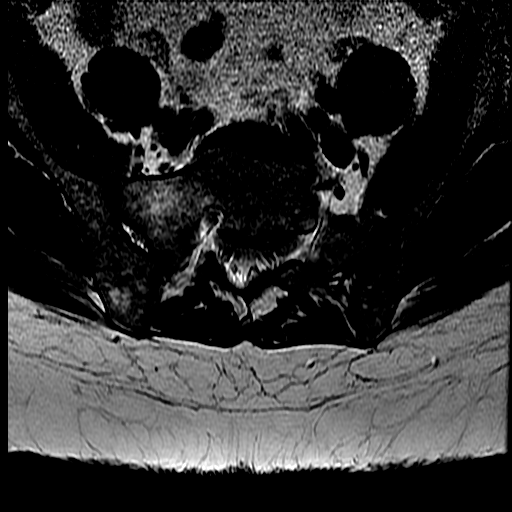
[im 5/40]
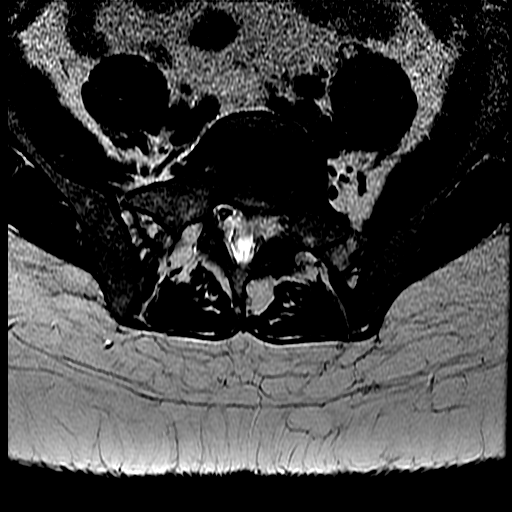
[im 8/40]
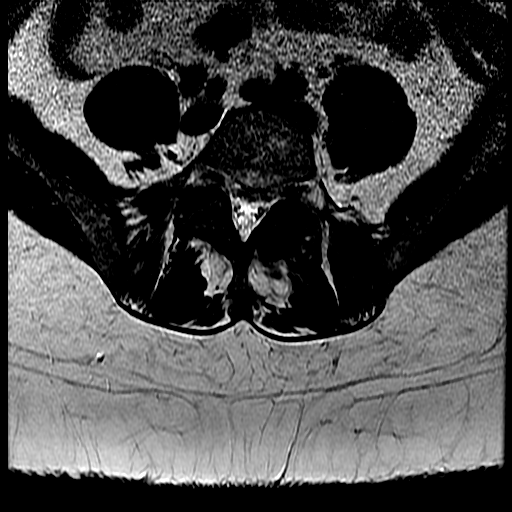
[im 13/40]
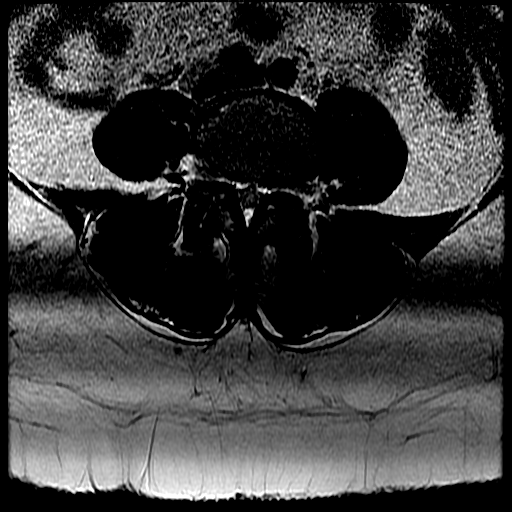
[im 18/40]
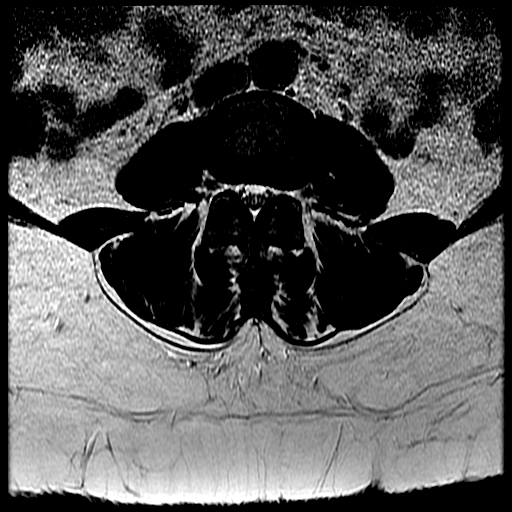
[im 20/40]
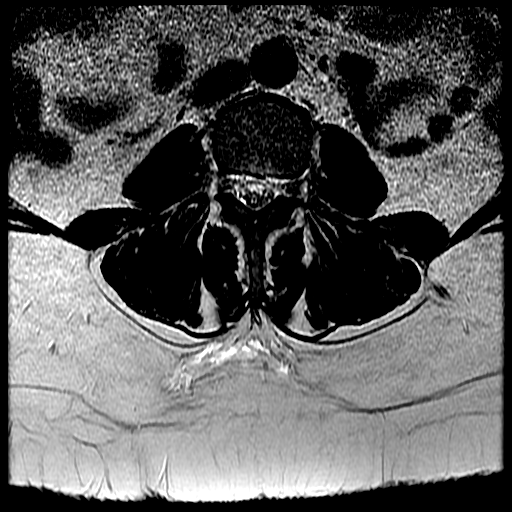
[im 22/40]
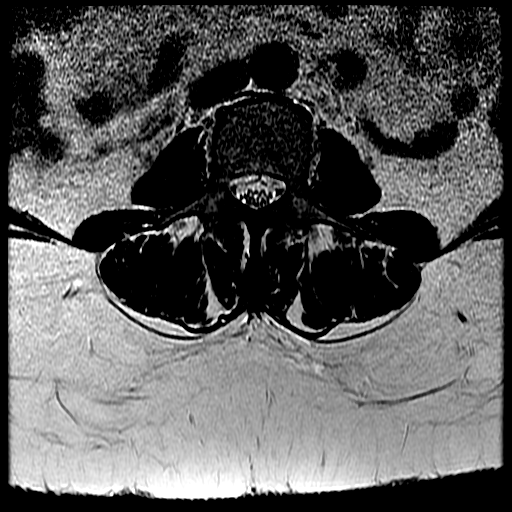
[im 35/40]
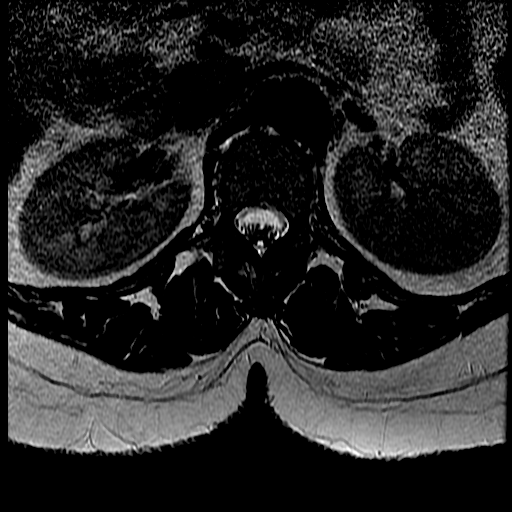

[Series 8: T1 · axial · 4.0mm · 0.39mm/px · z∈[+2,+181]mm · 3 of 40 slices shown (2 of 2)]
[im 5/40]
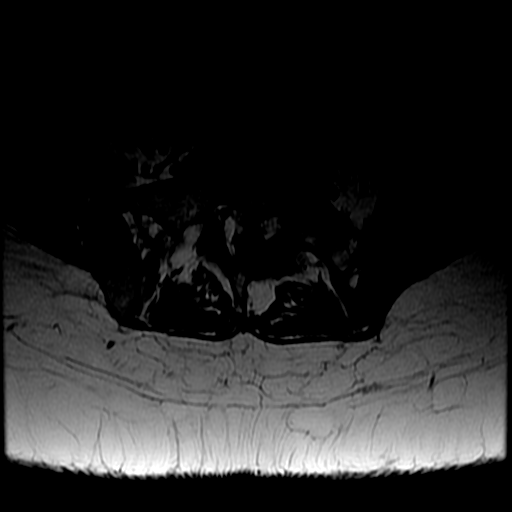
[im 20/40]
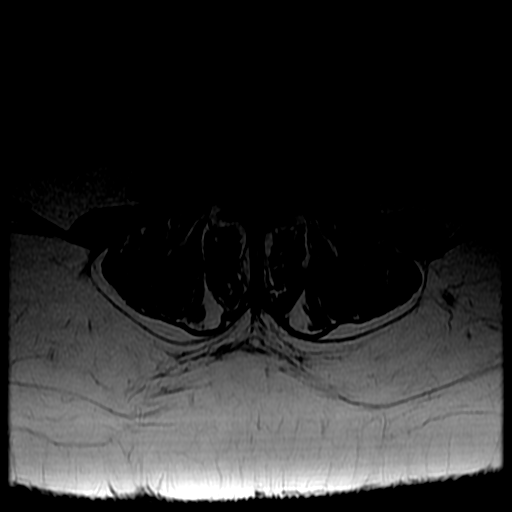
[im 35/40]
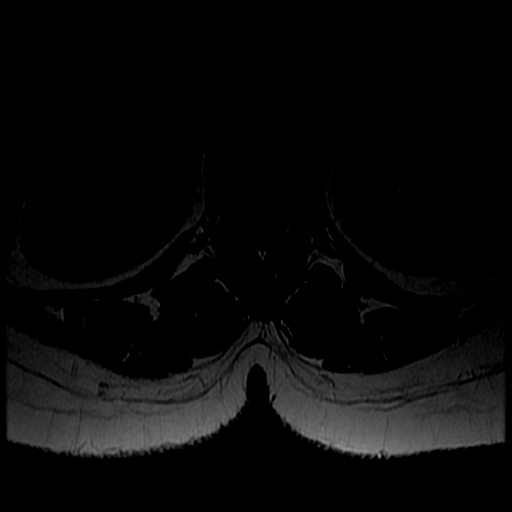

[19 of 48 positions shown; findings below may reference images not displayed]

FINDINGS: Normal lumbar alignment. Negative for fracture or mass lesion. Conus
medullaris is normal and terminates at L1-2.

L1-2:  Negative

L2-3: Mild disc and facet degeneration with mild narrowing of the
canal

L3-4: Mild disc bulging and moderate facet hypertrophy with mild
spinal stenosis. Neural foramina widely patent.

L4-5: Diffuse bulging of the disc. Small disc protrusion lateral to
the foramen without significant L4 nerve root impingement. Extensive
facet and ligamentum flavum hypertrophy contributing to moderate to
severe spinal stenosis. Mild right foraminal narrowing

L5-S1: Mild disc degeneration. Moderate facet hypertrophy. Mild
foraminal narrowing bilaterally.
IMPRESSION: Mild spinal stenosis L3-4

Moderate to severe spinal stenosis L4-5 with severe facet
degeneration. Small left lateral disc protrusion

Moderate facet hypertrophy L5-S1 without significant spinal
stenosis.

## 2016-12-05 DIAGNOSIS — Z0271 Encounter for disability determination: Secondary | ICD-10-CM

## 2018-05-24 DIAGNOSIS — Z0000 Encounter for general adult medical examination without abnormal findings: Secondary | ICD-10-CM | POA: Diagnosis not present

## 2018-05-24 DIAGNOSIS — E119 Type 2 diabetes mellitus without complications: Secondary | ICD-10-CM | POA: Diagnosis not present

## 2018-05-24 DIAGNOSIS — Z124 Encounter for screening for malignant neoplasm of cervix: Secondary | ICD-10-CM | POA: Diagnosis not present

## 2018-05-24 DIAGNOSIS — M545 Low back pain: Secondary | ICD-10-CM | POA: Diagnosis not present

## 2018-06-01 DIAGNOSIS — Z1231 Encounter for screening mammogram for malignant neoplasm of breast: Secondary | ICD-10-CM | POA: Diagnosis not present

## 2018-06-10 ENCOUNTER — Ambulatory Visit (INDEPENDENT_AMBULATORY_CARE_PROVIDER_SITE_OTHER): Payer: 59 | Admitting: Orthopaedic Surgery

## 2018-06-10 ENCOUNTER — Ambulatory Visit (INDEPENDENT_AMBULATORY_CARE_PROVIDER_SITE_OTHER): Payer: 59

## 2018-06-10 ENCOUNTER — Encounter (INDEPENDENT_AMBULATORY_CARE_PROVIDER_SITE_OTHER): Payer: Self-pay | Admitting: Orthopaedic Surgery

## 2018-06-10 DIAGNOSIS — M25571 Pain in right ankle and joints of right foot: Secondary | ICD-10-CM

## 2018-06-10 DIAGNOSIS — M79605 Pain in left leg: Secondary | ICD-10-CM

## 2018-06-10 DIAGNOSIS — M19071 Primary osteoarthritis, right ankle and foot: Secondary | ICD-10-CM | POA: Diagnosis not present

## 2018-06-10 NOTE — Progress Notes (Signed)
Office Visit Note   Patient: Savannah Peterson           Date of Birth: 14-Jun-1956           MRN: [id number] Visit Date: 06/10/2018              Requested by: Lenell Antu, DO [address] [place], [place] [address] PCP: Lenell Antu, DO   Assessment & Plan: Visit Diagnoses:  1. Arthritis of ankle, right   2. Pain in right ankle and joints of right foot   3. Pain in left leg     Plan: I gave her reassurance that the x-rays appeared stable of both her left tibial left ankle and right ankle.  She still not interested in any other surgical intervention with hardware removal of the right ankle and I agree that she does not need that.  She understands that she does have arthritic changes of the ankle but thus far they are stable.  All questions concerns were answered and addressed.  She can still work on ankle stretching and strengthening exercises of her legs as well as activity modification.  She can trial ankle braces or wraps if needed.  She can also occasionally use a cane to offload her right ankle.  I did give her a placard to take to the [facility] for handicap sticker.  All question concerns were answered and addressed.  Follow-up will be as needed.  Follow-Up Instructions: Return if symptoms worsen or fail to improve.   Orders:  Orders Placed This Encounter  Procedures  . XR Ankle Complete Right  . XR Tibia/Fibula Left   No orders of the defined types were placed in this encounter.     Procedures: No procedures performed   Clinical Data: No additional findings.   Subjective: Chief Complaint  Patient presents with  . Right Ankle - Pain  . Left Leg - Pain  The patient is someone that I have not seen in quite some time.  I originally saw her for a right ankle fracture dislocation several years ago.  She underwent open reduction and fixation and eventually had to have hardware removed due to chronic pain in the ankle and ankle arthroscopy.  We only show minimal arthritic changes.   She did have doing well we have not seen her back.  In February of this year she sustained some type of mechanical fall when she was getting gas at a gas station ability.  She felt like she injured her left ankle at the time of the right ankle is been hurting since she has had a skin discoloration, on her anterior shin on the left side and wants to take a look at that.  She said it was not there previous.  She does not remember hitting her shin in any way but has this area of white or light discoloration for someone who is a darker skin complexion and is African-American.  She is walking without assistive device and does not appear to have any significant limp.  It is now been about 7 months since her most recent fall and injury.  She points to the lateral aspect of her right ankle and anterior right ankle source of her pain mainly.  HPI  Review of Systems She is currently denies any headache, chest pain, shortness of breath, fever, chills, nausea, vomiting.  Objective: Vital Signs: LMP 09/07/2009   Physical Exam She is alert and oriented x3 and in no acute  distress Ortho Exam Examination of her right and left ankles show full range of motion.  Both ankles are ligamentously stable and neither have any effusion or swelling.  The right ankle has a well-healed lateral scar.  She has just slight decreased dorsiflexion of the right ankle when comparing that to the left ankle.  There is an area of skin discoloration of the mid tibia anteriorly.  It is an area that almost reminds me of skin pigmentation changes that can be seen after steroid injection but obviously she has not had a steroid injection in this area.  There is no raised area or or palpable defect.  There is no worrisome areas or edges to this at all.  There is no redness and is not tender to palpation.  There is no drainage. Specialty Comments:  No specialty comments available.  Imaging: Xr Ankle Complete Right  Result Date: 06/10/2018 3  views of the right ankle show previous hardware around the fibula.  The ankle mortise is well located.  There is fusion of the syndesmosis.  There is bone spurring medially.    PMFS History: Patient Active Problem List   Diagnosis Date Noted  . Neck pain on right side 12/14/2015  . Headache 12/14/2015  . HTN (hypertension) 06/20/2014  . Arthritis of ankle, right 01/15/2014  . Fracture of right ankle, lateral malleolus 10/11/2012   Past Medical History:  Diagnosis Date  . Allergy    seasonal  . Hypertension   . Obesity     Family History  Problem Relation Age of Onset  . Hypertension Mother   . Hyperlipidemia Mother   . Colon polyps Mother   . Hyperlipidemia Sister   . Hypertension Sister   . Hyperlipidemia Brother   . Hypertension Brother   . Hyperlipidemia Sister   . Hypertension Sister   . Colon cancer Neg Hx   . Rectal cancer Neg Hx   . Stomach cancer Neg Hx   . Heart attack Mother   . Dementia Mother     Past Surgical History:  Procedure Laterality Date  . ANKLE ARTHROSCOPY Right 01/15/2014   Procedure: RIGHT ANKLE ARTHROSCOPY WITH DEBRIDEMENT, REMOVAL OF 2 SYNDESMOSIS SCREWS RIGHT ANKLE;  Surgeon: Kathryne Hitch, MD;  Location: [facility] OR;  Service: Orthopedics;  Laterality: Right;  . HARDWARE REMOVAL Right 01/15/2014   Procedure: HARDWARE REMOVAL;  Surgeon: Kathryne Hitch, MD;  Location: [facility] OR;  Service: Orthopedics;  Laterality: Right;  . ORIF ANKLE FRACTURE  10/11/2012   Procedure: OPEN REDUCTION INTERNAL FIXATION (ORIF) ANKLE FRACTURE;  Surgeon: Kathryne Hitch, MD;  Location: [facility];  Service: Orthopedics;  Laterality: Right;  Open Reduction Internal Fixation Right Ankle Fracture  . TUBAL LIGATION  1982   Social History   Occupational History  . Occupation: Unemployed  Tobacco Use  . Smoking status: Never Smoker  . Smokeless tobacco: Never Used  Substance and Sexual Activity  . Alcohol use: Yes    Alcohol/week: 0.0 standard drinks     Comment: Maybe one glass of wine per month.  . Drug use: No  . Sexual activity: Yes    Birth control/protection: Surgical

## 2018-06-13 DIAGNOSIS — E782 Mixed hyperlipidemia: Secondary | ICD-10-CM | POA: Diagnosis not present

## 2018-06-13 DIAGNOSIS — I10 Essential (primary) hypertension: Secondary | ICD-10-CM | POA: Diagnosis not present

## 2018-06-25 DIAGNOSIS — R921 Mammographic calcification found on diagnostic imaging of breast: Secondary | ICD-10-CM | POA: Diagnosis not present

## 2018-08-08 DIAGNOSIS — E782 Mixed hyperlipidemia: Secondary | ICD-10-CM | POA: Diagnosis not present

## 2018-08-08 DIAGNOSIS — Z6835 Body mass index (BMI) 35.0-35.9, adult: Secondary | ICD-10-CM | POA: Diagnosis not present

## 2018-08-08 DIAGNOSIS — K5730 Diverticulosis of large intestine without perforation or abscess without bleeding: Secondary | ICD-10-CM | POA: Diagnosis not present

## 2018-08-08 DIAGNOSIS — D128 Benign neoplasm of rectum: Secondary | ICD-10-CM | POA: Diagnosis not present

## 2018-08-08 DIAGNOSIS — E669 Obesity, unspecified: Secondary | ICD-10-CM | POA: Diagnosis not present

## 2018-08-08 DIAGNOSIS — K621 Rectal polyp: Secondary | ICD-10-CM | POA: Diagnosis not present

## 2018-08-08 DIAGNOSIS — Z1211 Encounter for screening for malignant neoplasm of colon: Secondary | ICD-10-CM | POA: Diagnosis not present

## 2018-08-08 DIAGNOSIS — I10 Essential (primary) hypertension: Secondary | ICD-10-CM | POA: Diagnosis not present

## 2020-01-13 ENCOUNTER — Ambulatory Visit: Payer: 59 | Admitting: Orthopaedic Surgery

## 2020-11-08 ENCOUNTER — Ambulatory Visit (INDEPENDENT_AMBULATORY_CARE_PROVIDER_SITE_OTHER): Payer: 59 | Admitting: Orthopaedic Surgery

## 2020-11-08 ENCOUNTER — Ambulatory Visit (INDEPENDENT_AMBULATORY_CARE_PROVIDER_SITE_OTHER): Payer: 59

## 2020-11-08 ENCOUNTER — Other Ambulatory Visit: Payer: Self-pay

## 2020-11-08 DIAGNOSIS — M25571 Pain in right ankle and joints of right foot: Secondary | ICD-10-CM

## 2020-11-08 DIAGNOSIS — M7061 Trochanteric bursitis, right hip: Secondary | ICD-10-CM

## 2020-11-08 DIAGNOSIS — M25551 Pain in right hip: Secondary | ICD-10-CM

## 2020-11-08 MED ORDER — METHYLPREDNISOLONE 4 MG PO TABS: ORAL_TABLET | ORAL | 0 refills | Status: DC

## 2020-11-08 NOTE — Progress Notes (Signed)
Office Visit Note   Patient: Savannah Peterson           Date of Birth: 19-Apr-1956           MRN: [id number] Visit Date: 11/08/2020              Requested by: No referring provider defined for this encounter. PCP: No primary care provider on file.   Assessment & Plan: Visit Diagnoses:  1. Pain in right ankle and joints of right foot   2. Pain in right hip   3. Trochanteric bursitis, right hip     Plan: Her main complaint and signs and symptoms of right hip pain are consistent with trochanteric bursitis.  I showed her stretching exercises I want her to try.  I would like her to get some Voltaren gel and will put her on a 6-day steroid taper.  I did recommend a steroid injection but she has deferred this unless it worsens for her.  She will try to not sleep on that side at night.  All questions and concerns were answered and addressed.  Follow-up can be as needed.  Follow-Up Instructions: Return if symptoms worsen or fail to improve.   Orders:  Orders Placed This Encounter  Procedures  . XR Ankle Complete Right  . XR HIP UNILAT W OR W/O PELVIS 1V RIGHT   Meds ordered this encounter  Medications  . methylPREDNISolone (MEDROL) 4 MG tablet    Sig: Medrol dose pack. Take as instructed    Dispense:  21 tablet    Refill:  0      Procedures: No procedures performed   Clinical Data: No additional findings.   Subjective: Chief Complaint  Patient presents with  . Right Ankle - Pain  . Right Hip - Pain  The patient comes in mainly today for evaluation treatment of right hip pain.  She points to the lateral side of her right hip as a source of her pain.  She denies any groin pain.  She says it hurts mainly when she lays on that side at night.  She says not severe.  She has a history of fixation of a right ankle fracture dislocation that we fixed 8 years ago.  We also placed syndesmotic screws that we had removed.  She says the ankle does hurt on occasion and she feels like a  little bit of change in her gait is what is affected her right hip.  She is not a diabetic.  She denies any numbness tingling her feet or any back pain or sciatic symptoms.  She said no other acute change in her medical status.  HPI  Review of Systems She currently denies any headache, chest pain, shortness of breath, fever, chills, nausea, vomiting  Objective: Vital Signs: LMP 09/07/2009   Physical Exam She is alert and orient x3 and in no acute distress Ortho Exam Examination of her right hip shows full and fluid range of motion of that hip with no blocks to rotation and no pain in the groin.  There is pain to palpation over the trochanteric area and the proximal IT band.  Her knee exam on the right side is normal.  Her ankle exam shows a well-healed surgical incision laterally.  She actually has really good range of motion of that right ankle and no instability on exam.  Her foot is well-perfused with normal sensation. Specialty Comments:  No specialty comments available.  Imaging: XR HIP UNILAT W OR W/O PELVIS  1V RIGHT  Result Date: 11/08/2020 An AP pelvis and a lateral the right hip shows no acute findings.  The hip joint space is very well-maintained.  XR Ankle Complete Right  Result Date: 11/08/2020 3 views of the right ankle show intact hardware on the lateral aspect of the ankle.  The ankle mortise is congruent with some mild arthritic changes.  There is significant scarring at the ankle syndesmosis.    PMFS History: Patient Active Problem List   Diagnosis Date Noted  . Neck pain on right side 12/14/2015  . Headache 12/14/2015  . HTN (hypertension) 06/20/2014  . Arthritis of ankle, right 01/15/2014  . Fracture of right ankle, lateral malleolus 10/11/2012   Past Medical History:  Diagnosis Date  . Allergy    seasonal  . Hypertension   . Obesity     Family History  Problem Relation Age of Onset  . Hypertension Mother   . Hyperlipidemia Mother   . Colon polyps  Mother   . Hyperlipidemia Sister   . Hypertension Sister   . Hyperlipidemia Brother   . Hypertension Brother   . Hyperlipidemia Sister   . Hypertension Sister   . Colon cancer Neg Hx   . Rectal cancer Neg Hx   . Stomach cancer Neg Hx   . Heart attack Mother   . Dementia Mother     Past Surgical History:  Procedure Laterality Date  . ANKLE ARTHROSCOPY Right 01/15/2014   Procedure: RIGHT ANKLE ARTHROSCOPY WITH DEBRIDEMENT, REMOVAL OF 2 SYNDESMOSIS SCREWS RIGHT ANKLE;  Surgeon: Kathryne Hitch, MD;  Location: [facility] OR;  Service: Orthopedics;  Laterality: Right;  . HARDWARE REMOVAL Right 01/15/2014   Procedure: HARDWARE REMOVAL;  Surgeon: Kathryne Hitch, MD;  Location: [facility] OR;  Service: Orthopedics;  Laterality: Right;  . ORIF ANKLE FRACTURE  10/11/2012   Procedure: OPEN REDUCTION INTERNAL FIXATION (ORIF) ANKLE FRACTURE;  Surgeon: Kathryne Hitch, MD;  Location: [facility];  Service: Orthopedics;  Laterality: Right;  Open Reduction Internal Fixation Right Ankle Fracture  . TUBAL LIGATION  1982   Social History   Occupational History  . Occupation: Unemployed  Tobacco Use  . Smoking status: Never Smoker  . Smokeless tobacco: Never Used  Substance and Sexual Activity  . Alcohol use: Yes    Alcohol/week: 0.0 standard drinks    Comment: Maybe one glass of wine per month.  . Drug use: No  . Sexual activity: Yes    Birth control/protection: Surgical

## 2021-03-14 ENCOUNTER — Ambulatory Visit (INDEPENDENT_AMBULATORY_CARE_PROVIDER_SITE_OTHER): Payer: Self-pay

## 2021-03-14 ENCOUNTER — Ambulatory Visit (INDEPENDENT_AMBULATORY_CARE_PROVIDER_SITE_OTHER): Payer: PRIVATE HEALTH INSURANCE | Admitting: Orthopaedic Surgery

## 2021-03-14 ENCOUNTER — Other Ambulatory Visit: Payer: Self-pay

## 2021-03-14 ENCOUNTER — Encounter: Payer: Self-pay | Admitting: Orthopaedic Surgery

## 2021-03-14 DIAGNOSIS — M25571 Pain in right ankle and joints of right foot: Secondary | ICD-10-CM

## 2021-03-14 DIAGNOSIS — M25561 Pain in right knee: Secondary | ICD-10-CM

## 2021-03-14 DIAGNOSIS — M1711 Unilateral primary osteoarthritis, right knee: Secondary | ICD-10-CM

## 2021-03-14 DIAGNOSIS — G8929 Other chronic pain: Secondary | ICD-10-CM

## 2021-03-14 MED ORDER — METHYLPREDNISOLONE ACETATE 40 MG/ML IJ SUSP
40.0000 mg | INTRAMUSCULAR | Status: AC | PRN
Start: 2021-03-14 — End: 2021-03-14
  Administered 2021-03-14: 40 mg via INTRA_ARTICULAR

## 2021-03-14 MED ORDER — LIDOCAINE HCL 1 % IJ SOLN
3.0000 mL | INTRAMUSCULAR | Status: AC | PRN
  Administered 2021-03-14: 3 mL

## 2021-03-14 NOTE — Progress Notes (Signed)
Office Visit Note   Patient: Savannah Peterson           Date of Birth: 02-11-1956           MRN: [id number] Visit Date: 03/14/2021              Requested by: No referring provider defined for this encounter. PCP: No primary care provider on file.   Assessment & Plan: Visit Diagnoses:  1. Chronic pain of right knee   2. Pain in right ankle and joints of right foot   3. Unilateral primary osteoarthritis, right knee     Plan: I did recommend a steroid injection in her right knee today and I do feel that she is the perfect candidate for hyaluronic acid for the right knee if we can get this approved.  I described above we would recommend this as well as the risk and benefits involved.  I do feel that she should go to [employer] to have them look at her shoe wear and see about the possibility of better inserts for her shoes.  She agrees with this treatment plan.  We will see her back hopefully for follow-up when we can provide hyaluronic acid for the right knee.  All questions and concerns were answered and addressed.  Follow-Up Instructions: No follow-ups on file.   Orders:  Orders Placed This Encounter  Procedures   Large Joint Inj: R knee   XR Knee 1-2 Views Right   XR Ankle Complete Right   No orders of the defined types were placed in this encounter.     Procedures: Large Joint Inj: R knee on 03/14/2021 4:26 PM Indications: diagnostic evaluation and pain Details: 22 G 1.5 in needle, superolateral approach  Arthrogram: No  Medications: 3 mL lidocaine 1 %; 40 mg methylPREDNISolone acetate 40 MG/ML Outcome: tolerated well, no immediate complications Procedure, treatment alternatives, risks and benefits explained, specific risks discussed. Consent was given by the patient. Immediately prior to procedure a time out was called to verify the correct patient, procedure, equipment, support staff and site/side marked as required. Patient was prepped and draped in the usual sterile  fashion.      Clinical Data: No additional findings.   Subjective: Chief Complaint  Patient presents with   Right Ankle - Pain  The patient is actually well-known to me.  We performed open reduction/internal fixation of a right complex lateral malleolus fracture of the ankle in January 2014.  In April 2015 we did perform an arthroscopic intervention to remove scar tissue from the ankle joint itself.  She has been developing some right knee pain and some pain in her right foot along the fifth metatarsal area.  She does report better shoe wear and wears Shon Baton and she is asking about the potential for custom inserts for her shoes.  She does perform work but does have her stand and walk quite a bit.  She points the medial aspect of her right knee as a source of her pain.  Both are bothersome enough that she comes in today to be checked out.  She otherwise denies any acute change in her medical status.  She is not a diabetic.  HPI  Review of Systems There is currently listed no headache, chest pain, shortness of breath, fever, chills, nausea, vomiting  Objective: Vital Signs: LMP 09/07/2009   Physical Exam She is alert and oriented x3 and in no acute distress Ortho Exam Examination of her right knee she has medial  joint line tenderness but full range of motion.  There is some slight patellofemoral crepitation.  The knee is ligamentously stable.  Examination of right ankle shows a well-healed lateral surgical incision.  She has good range of motion of the right ankle and it is stable.  There is no ankle effusion.  There is just some mild pain over the fifth metatarsal. Specialty Comments:  No specialty comments available.  Imaging: XR Ankle Complete Right  Result Date: 03/14/2021 3 views of the right ankle show an intact ankle mortise.  There is previous hardware on the lateral aspect of ankle from fixation of a lateral malleus fracture.  That is healed.  There is sclerotic changes of the  syndesmosis area of the ankle but no instability assessed on plain films.  The ankle joint itself is well-maintained.  XR Knee 1-2 Views Right  Result Date: 03/14/2021 2 views of the right knee show osteoarthritis with slight varus malalignment, significant medial joint space narrowing and patellofemoral narrowing as well as periarticular osteophytes.    PMFS History: Patient Active Problem List   Diagnosis Date Noted   Neck pain on right side 12/14/2015   Headache 12/14/2015   HTN (hypertension) 06/20/2014   Arthritis of ankle, right 01/15/2014   Fracture of right ankle, lateral malleolus 10/11/2012   Past Medical History:  Diagnosis Date   Allergy    seasonal   Hypertension    Obesity     Family History  Problem Relation Age of Onset   Hypertension Mother    Hyperlipidemia Mother    Colon polyps Mother    Hyperlipidemia Sister    Hypertension Sister    Hyperlipidemia Brother    Hypertension Brother    Hyperlipidemia Sister    Hypertension Sister    Colon cancer Neg Hx    Rectal cancer Neg Hx    Stomach cancer Neg Hx    Heart attack Mother    Dementia Mother     Past Surgical History:  Procedure Laterality Date   ANKLE ARTHROSCOPY Right 01/15/2014   Procedure: RIGHT ANKLE ARTHROSCOPY WITH DEBRIDEMENT, REMOVAL OF 2 SYNDESMOSIS SCREWS RIGHT ANKLE;  Surgeon: Kathryne Hitch, MD;  Location: [facility] OR;  Service: Orthopedics;  Laterality: Right;   HARDWARE REMOVAL Right 01/15/2014   Procedure: HARDWARE REMOVAL;  Surgeon: Kathryne Hitch, MD;  Location: [facility] OR;  Service: Orthopedics;  Laterality: Right;   ORIF ANKLE FRACTURE  10/11/2012   Procedure: OPEN REDUCTION INTERNAL FIXATION (ORIF) ANKLE FRACTURE;  Surgeon: Kathryne Hitch, MD;  Location: [facility] ORS;  Service: Orthopedics;  Laterality: Right;  Open Reduction Internal Fixation Right Ankle Fracture   TUBAL LIGATION  1982   Social History   Occupational History   Occupation: Unemployed  Tobacco Use    Smoking status: Never   Smokeless tobacco: Never  Substance and Sexual Activity   Alcohol use: Yes    Alcohol/week: 0.0 standard drinks    Comment: Maybe one glass of wine per month.   Drug use: No   Sexual activity: Yes    Birth control/protection: Surgical

## 2021-03-15 ENCOUNTER — Telehealth: Payer: Self-pay

## 2021-03-15 NOTE — Telephone Encounter (Signed)
Right knee gel injection  

## 2021-03-15 NOTE — Telephone Encounter (Signed)
Noted  

## 2021-04-08 ENCOUNTER — Telehealth: Payer: Self-pay

## 2021-04-08 NOTE — Telephone Encounter (Signed)
Will call patient to verify insurance.

## 2021-04-11 ENCOUNTER — Telehealth: Payer: Self-pay

## 2021-04-11 NOTE — Telephone Encounter (Signed)
Dr. Magnus Ivan would like patient to try gel injection for right knee, Monovisc, if possible. Patient is WC could you please advise.  Thank you.

## 2021-04-12 NOTE — Telephone Encounter (Signed)
Noted. Talked with patient and she stated that she will call back with new insurance information and I will submit for gel injection at that time.

## 2022-06-14 ENCOUNTER — Encounter: Payer: Self-pay | Admitting: Orthopaedic Surgery

## 2022-06-14 ENCOUNTER — Ambulatory Visit (INDEPENDENT_AMBULATORY_CARE_PROVIDER_SITE_OTHER): Payer: Self-pay

## 2022-06-14 ENCOUNTER — Ambulatory Visit (INDEPENDENT_AMBULATORY_CARE_PROVIDER_SITE_OTHER): Payer: PRIVATE HEALTH INSURANCE | Admitting: Orthopaedic Surgery

## 2022-06-14 DIAGNOSIS — M25571 Pain in right ankle and joints of right foot: Secondary | ICD-10-CM

## 2022-06-14 NOTE — Progress Notes (Signed)
The patient is getting close to 10 years out from open reduction/internal fixation of a complex right ankle fracture dislocation.  She is an active 66 year old female.  She says sometimes she is getting some pain in her ankle from what she feels like may be overcompensating.  Even has a history of a right ankle arthroscopy.  She does stand a lot at work.  She points to the lateral aspect of source of her pain but she says it is nothing severe.  She does have excellent range of motion of her right ankle.  There are some slight prominence of the hardware but this is only slight.  There is no effusion of the ankle.  There is only some slight global pain.  The ankle feels ligamentously stable.  3 views of the right ankle show intact hardware from previous surgery.  There is some mild arthritic changes medial and at the interface between the talus and the navicular.  These are both mild.  There is no soft tissue swelling or effusion.  I think she would best benefit from a copper fit ankle sleeve that she can wear on occasion since her pain is not significant.  She is not walking with a limp and she is stable so I am not recommending therapy at all.  This is more reassurance that the ankle still looks good at a decade after her original injury.  All questions and concerns were answered and addressed.

## 2023-04-16 ENCOUNTER — Emergency Department: Payer: No Typology Code available for payment source

## 2023-04-16 ENCOUNTER — Emergency Department
Admission: EM | Admit: 2023-04-16 | Discharge: 2023-04-16 | Disposition: A | Discharge status: Home / Self Care | Payer: No Typology Code available for payment source | Attending: Emergency Medicine | Admitting: Emergency Medicine

## 2023-04-16 ENCOUNTER — Other Ambulatory Visit: Payer: Self-pay

## 2023-04-16 ENCOUNTER — Encounter: Payer: Self-pay | Admitting: Emergency Medicine

## 2023-04-16 DIAGNOSIS — Y92410 Unspecified street and highway as the place of occurrence of the external cause: Secondary | ICD-10-CM | POA: Diagnosis not present

## 2023-04-16 DIAGNOSIS — M25552 Pain in left hip: Secondary | ICD-10-CM | POA: Insufficient documentation

## 2023-04-16 DIAGNOSIS — G44319 Acute post-traumatic headache, not intractable: Secondary | ICD-10-CM | POA: Diagnosis not present

## 2023-04-16 DIAGNOSIS — I10 Essential (primary) hypertension: Secondary | ICD-10-CM | POA: Insufficient documentation

## 2023-04-16 DIAGNOSIS — M542 Cervicalgia: Secondary | ICD-10-CM | POA: Insufficient documentation

## 2023-04-16 DIAGNOSIS — M79632 Pain in left forearm: Secondary | ICD-10-CM | POA: Diagnosis present

## 2023-04-16 DIAGNOSIS — S5012XA Contusion of left forearm, initial encounter: Secondary | ICD-10-CM | POA: Diagnosis not present

## 2023-04-16 MED ORDER — METHOCARBAMOL 500 MG PO TABS: 500.0000 mg | ORAL_TABLET | Freq: Four times a day (QID) | ORAL | 0 refills | Status: DC | PRN

## 2023-04-16 MED ORDER — HYDROCODONE-ACETAMINOPHEN 5-325 MG PO TABS: 1.0000 | ORAL_TABLET | Freq: Four times a day (QID) | ORAL | 0 refills | Status: DC | PRN
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
## 2023-04-16 NOTE — Discharge Instructions (Addendum)
Follow-up with your primary care provider if any continued problems or concerns.  If you do not have a primary care provider a list of offices in [place] are on your discharge papers.  A prescription for hydrocodone was sent to the pharmacy to take as needed for pain and methocarbamol for a muscle relaxant.  Do not take these medications and drive or operate machinery as it could cause drowsiness.  You may use ice or heat to your back, neck, hip and left forearm as needed.  You may take ibuprofen in addition to the medication if additional pain medication is needed.  If you continue to have problems with your neck you should call make an appointment with Dr. Marcell Barlow who is on-call for neurosurgery as you have chronic changes on your cervical spine CT.  Please go to the following website to schedule new (and existing) patient appointments:   [email or web address]   The following is a list of primary care offices in the area who are accepting new patients at this time.  Please reach out to one of them directly and let them know you would like to schedule an appointment to follow up on an Emergency Department visit, and/or to establish a new primary care provider (PCP).  There are likely other primary care clinics in the are who are accepting new patients, but this is an excellent place to start:  [facility] Lead physician: Dr Shirlee Latch [address] [place], [place] [address] [phone]  [facility] Lead Physician: Dr Alba Cory [address], [place], [place] [address] [phone]  [facility]  Lead Physician: Dr Olevia Perches [address] [place], [place] [address] [phone]  [facility] Lead Physician: Dr Sofie Hartigan [address], [place], [place] [address] [phone]  [facility] at [facility] Lead Physician: Dr Bari Edward [address] [place], [place], [place] [address] [phone]
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
## 2023-04-30 ENCOUNTER — Ambulatory Visit: Payer: Self-pay | Admitting: Orthopaedic Surgery

## 2023-05-21 ENCOUNTER — Other Ambulatory Visit (INDEPENDENT_AMBULATORY_CARE_PROVIDER_SITE_OTHER): Payer: PRIVATE HEALTH INSURANCE

## 2023-05-21 ENCOUNTER — Encounter: Payer: Self-pay | Admitting: Orthopaedic Surgery

## 2023-05-21 ENCOUNTER — Ambulatory Visit (INDEPENDENT_AMBULATORY_CARE_PROVIDER_SITE_OTHER): Payer: PRIVATE HEALTH INSURANCE | Admitting: Orthopaedic Surgery

## 2023-05-21 DIAGNOSIS — M25571 Pain in right ankle and joints of right foot: Secondary | ICD-10-CM
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
## 2023-07-10 ENCOUNTER — Ambulatory Visit: Payer: 59 | Admitting: Podiatry

## 2023-07-10 DIAGNOSIS — M21962 Unspecified acquired deformity of left lower leg: Secondary | ICD-10-CM | POA: Diagnosis not present

## 2023-07-10 DIAGNOSIS — M21961 Unspecified acquired deformity of right lower leg: Secondary | ICD-10-CM | POA: Diagnosis not present

## 2023-07-10 NOTE — Progress Notes (Signed)
  Subjective:  Patient ID: Savannah Peterson, female    DOB: 06/30/1956,  MRN: [id number]  No chief complaint on file.   67 y.o. female presents with the above complaint.  Patient presents complaint bilateral flatfoot deformity.  Patient states that she has worn orthotics in the past which seems to help.  She would like to get another pair of orthotics made.  She is does not have any pain at this time.  Denies any other acute complaints.  Spirits are wearing out Review of Systems: Negative except as noted in the HPI. Denies N/V/F/Ch.  Past Medical History:  Diagnosis Date   Allergy    seasonal   Hypertension    Obesity     Current Outpatient Medications:    amLODipine (NORVASC) 10 MG tablet, TAKE 1 TABLET BY MOUTH ONCE DAILY  "NO MORE REFILLS WITHOUT OFFICE VISIT", Disp: 15 tablet, Rfl: 0   hydrochlorothiazide (MICROZIDE) 12.5 MG capsule, Take 1 capsule (12.5 mg total) by mouth daily., Disp: 30 capsule, Rfl: 5   HYDROcodone-acetaminophen (NORCO/VICODIN) 5-325 MG tablet, Take 1 tablet by mouth every 6 (six) hours as needed., Disp: 15 tablet, Rfl: 0   methocarbamol (ROBAXIN) 500 MG tablet, Take 1 tablet (500 mg total) by mouth every 6 (six) hours as needed for muscle spasms., Disp: 15 tablet, Rfl: 0   methylPREDNISolone (MEDROL) 4 MG tablet, Medrol dose pack. Take as instructed, Disp: 21 tablet, Rfl: 0   Multiple Vitamin (MULTIVITAMIN WITH MINERALS) TABS, Take 1 tablet by mouth daily., Disp: , Rfl:    nortriptyline (PAMELOR) 25 MG capsule, One tab every night for one week, then 2 tabs every night, Disp: 60 capsule, Rfl: 6  Social History   Tobacco Use  Smoking Status Never  Smokeless Tobacco Never    Allergies  Allergen Reactions   Ace Inhibitors    Asa [Aspirin]    Penicillins Rash   Objective:  There were no vitals filed for this visit. There is no height or weight on file to calculate BMI. Constitutional Well developed. Well nourished.  Vascular Dorsalis pedis pulses  palpable bilaterally. Posterior tibial pulses palpable bilaterally. Capillary refill normal to all digits.  No cyanosis or clubbing noted. Pedal hair growth normal.  Neurologic Normal speech. Oriented to person, place, and time. Epicritic sensation to light touch grossly present bilaterally.  Dermatologic Nails well groomed and normal in appearance. No open wounds. No skin lesions.  Orthopedic: Gait examination shows pes planovalgus deformity with calcaneovalgus to many toe signs partially recreate the arch with dorsiflexion of the hallux unable to perform single and double heel raise.   Radiographs: None Assessment:   1. Deformity of both feet    Plan:  Patient was evaluated and treated and all questions answered.  Pes planovalgus/foot deformity -I explained to patient the etiology of pes planovalgus and relationship with Planter fasciitis and various treatment options were discussed.  Given patient foot structure in the setting of Planter fasciitis I believe patient will benefit from custom-made orthotics to help control the hindfoot motion support the arch of the foot and take the stress away from plantar fascial.  Patient agrees with the plan like to proceed with orthotics -Patient was casted for orthotics  No follow-ups on file.

## 2023-08-10 NOTE — Progress Notes (Signed)
Orthotic order placed will call for fitting when in  [place]

## 2023-08-13 ENCOUNTER — Telehealth: Payer: Self-pay | Admitting: Podiatry

## 2023-08-13 NOTE — Telephone Encounter (Signed)
Pt called checking on status of orthotics. She was cast on 10/8 in [place] office. Please call pt when they come in.

## 2023-08-15 ENCOUNTER — Ambulatory Visit: Payer: 59 | Admitting: Orthopaedic Surgery

## 2023-08-15 DIAGNOSIS — M7062 Trochanteric bursitis, left hip: Secondary | ICD-10-CM

## 2023-08-15 MED ORDER — METHYLPREDNISOLONE ACETATE 40 MG/ML IJ SUSP
40.0000 mg | INTRAMUSCULAR | Status: AC | PRN
Start: 2023-08-15 — End: 2023-08-15
  Administered 2023-08-15: 40 mg via INTRA_ARTICULAR

## 2023-08-15 MED ORDER — LIDOCAINE HCL 1 % IJ SOLN
3.0000 mL | INTRAMUSCULAR | Status: AC | PRN
Start: 2023-08-15 — End: 2023-08-15
  Administered 2023-08-15: 3 mL
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
## 2023-09-12 ENCOUNTER — Ambulatory Visit (INDEPENDENT_AMBULATORY_CARE_PROVIDER_SITE_OTHER): Payer: 59 | Admitting: Orthopaedic Surgery

## 2023-09-12 ENCOUNTER — Encounter: Payer: Self-pay | Admitting: Orthopaedic Surgery

## 2023-09-12 DIAGNOSIS — M7051 Other bursitis of knee, right knee: Secondary | ICD-10-CM | POA: Diagnosis not present

## 2023-09-12 DIAGNOSIS — M7050 Other bursitis of knee, unspecified knee: Secondary | ICD-10-CM | POA: Diagnosis not present

## 2023-09-12 MED ORDER — METHYLPREDNISOLONE ACETATE 40 MG/ML IJ SUSP
20.0000 mg | INTRAMUSCULAR | Status: AC | PRN
Start: 2023-09-12 — End: 2023-09-12
  Administered 2023-09-12: 20 mg via INTRAMUSCULAR

## 2023-09-12 MED ORDER — LIDOCAINE HCL 1 % IJ SOLN
0.5000 mL | INTRAMUSCULAR | Status: AC | PRN
Start: 2023-09-12 — End: 2023-09-12
  Administered 2023-09-12: .5 mL
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
## 2023-10-04 ENCOUNTER — Other Ambulatory Visit: Payer: Medicare Other

## 2023-10-25 ENCOUNTER — Ambulatory Visit: Payer: 59

## 2023-10-25 NOTE — Progress Notes (Signed)
Patient presents today to pick up custom molded foot orthotics, diagnosed with BIL Foot Deformity by Dr. Allena Katz.   Orthotics were dispensed and fit was satisfactory. Reviewed instructions for break-in and wear. Written instructions given to patient.  Patient will follow up as needed.  Addison Bailey CPed, CFo, CFm

## 2024-07-30 ENCOUNTER — Encounter: Payer: Self-pay | Admitting: Physician Assistant

## 2024-07-30 ENCOUNTER — Ambulatory Visit: Payer: Self-pay | Admitting: Physician Assistant

## 2024-07-30 DIAGNOSIS — M7062 Trochanteric bursitis, left hip: Secondary | ICD-10-CM
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
